# Patient Record
Sex: Male | Born: 1970 | Race: Black or African American | Hispanic: No | Smoking: Former smoker
Health system: Southern US, Community
[De-identification: ages and names within clinical notes are randomized; demographics above are authoritative.]

## PROBLEM LIST (undated history)

## (undated) DIAGNOSIS — I10 Essential (primary) hypertension: Secondary | ICD-10-CM

## (undated) DIAGNOSIS — E119 Type 2 diabetes mellitus without complications: Secondary | ICD-10-CM

## (undated) DIAGNOSIS — I2510 Atherosclerotic heart disease of native coronary artery without angina pectoris: Secondary | ICD-10-CM

## (undated) HISTORY — DX: Atherosclerotic heart disease of native coronary artery without angina pectoris: I25.10

## (undated) HISTORY — PX: SHOULDER SURGERY: SHX246

---

## 2000-08-02 ENCOUNTER — Emergency Department (HOSPITAL_COMMUNITY): Admission: EM | Admit: 2000-08-02 | Discharge: 2000-08-03 | Payer: Self-pay | Admitting: Emergency Medicine

## 2004-02-02 ENCOUNTER — Emergency Department (HOSPITAL_COMMUNITY): Admission: EM | Admit: 2004-02-02 | Discharge: 2004-02-03 | Payer: Self-pay | Admitting: Emergency Medicine

## 2004-02-08 ENCOUNTER — Ambulatory Visit (HOSPITAL_COMMUNITY): Admission: RE | Admit: 2004-02-08 | Discharge: 2004-02-08 | Payer: Self-pay | Admitting: Orthopedic Surgery

## 2004-09-02 ENCOUNTER — Emergency Department (HOSPITAL_COMMUNITY): Admission: EM | Admit: 2004-09-02 | Discharge: 2004-09-03 | Payer: Self-pay | Admitting: Emergency Medicine

## 2005-10-21 ENCOUNTER — Ambulatory Visit (HOSPITAL_COMMUNITY): Admission: RE | Admit: 2005-10-21 | Discharge: 2005-10-21 | Payer: Self-pay | Admitting: Orthopedic Surgery

## 2010-09-25 ENCOUNTER — Emergency Department (HOSPITAL_COMMUNITY)
Admission: EM | Admit: 2010-09-25 | Discharge: 2010-09-25 | Payer: Self-pay | Source: Home / Self Care | Admitting: Emergency Medicine
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
## 2015-09-01 ENCOUNTER — Encounter (HOSPITAL_COMMUNITY): Payer: Self-pay | Admitting: *Deleted

## 2015-09-01 ENCOUNTER — Emergency Department (HOSPITAL_COMMUNITY)
Admission: EM | Admit: 2015-09-01 | Discharge: 2015-09-01 | Disposition: A | Discharge status: Home / Self Care | Payer: No Typology Code available for payment source | Attending: Emergency Medicine | Admitting: Emergency Medicine

## 2015-09-01 ENCOUNTER — Emergency Department (HOSPITAL_COMMUNITY): Payer: No Typology Code available for payment source

## 2015-09-01 DIAGNOSIS — I10 Essential (primary) hypertension: Secondary | ICD-10-CM | POA: Insufficient documentation

## 2015-09-01 DIAGNOSIS — Z880 Allergy status to penicillin: Secondary | ICD-10-CM | POA: Diagnosis not present

## 2015-09-01 DIAGNOSIS — Y998 Other external cause status: Secondary | ICD-10-CM | POA: Diagnosis not present

## 2015-09-01 DIAGNOSIS — S299XXA Unspecified injury of thorax, initial encounter: Secondary | ICD-10-CM | POA: Diagnosis present

## 2015-09-01 DIAGNOSIS — Y9389 Activity, other specified: Secondary | ICD-10-CM | POA: Insufficient documentation

## 2015-09-01 DIAGNOSIS — Y92410 Unspecified street and highway as the place of occurrence of the external cause: Secondary | ICD-10-CM | POA: Insufficient documentation

## 2015-09-01 DIAGNOSIS — F17200 Nicotine dependence, unspecified, uncomplicated: Secondary | ICD-10-CM | POA: Diagnosis not present

## 2015-09-01 DIAGNOSIS — M5489 Other dorsalgia: Secondary | ICD-10-CM

## 2015-09-01 HISTORY — DX: Essential (primary) hypertension: I10

## 2015-09-01 MED ORDER — IBUPROFEN 400 MG PO TABS
800.0000 mg | ORAL_TABLET | Freq: Once | ORAL | Status: AC
Start: 2015-09-01 — End: 2015-09-01
  Administered 2015-09-01: 800 mg via ORAL
  Filled 2015-09-01: qty 4

## 2015-09-01 MED ORDER — ACETAMINOPHEN 325 MG PO TABS
650.0000 mg | ORAL_TABLET | Freq: Once | ORAL | Status: AC
  Administered 2015-09-01: 650 mg via ORAL
  Filled 2015-09-01: qty 2
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
## 2015-09-01 NOTE — ED Notes (Signed)
Pt is in stable condition upon d/c and ambulates from ED. 

## 2015-09-01 NOTE — ED Notes (Signed)
Patient transported to X-ray 
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
## 2016-02-24 ENCOUNTER — Emergency Department (HOSPITAL_COMMUNITY)
Admission: EM | Admit: 2016-02-24 | Discharge: 2016-02-24 | Disposition: A | Discharge status: Home / Self Care | Payer: No Typology Code available for payment source | Attending: Emergency Medicine | Admitting: Emergency Medicine

## 2016-02-24 ENCOUNTER — Encounter (HOSPITAL_COMMUNITY): Payer: Self-pay | Admitting: Family Medicine

## 2016-02-24 DIAGNOSIS — I10 Essential (primary) hypertension: Secondary | ICD-10-CM | POA: Insufficient documentation

## 2016-02-24 DIAGNOSIS — W57XXXA Bitten or stung by nonvenomous insect and other nonvenomous arthropods, initial encounter: Secondary | ICD-10-CM | POA: Insufficient documentation

## 2016-02-24 DIAGNOSIS — Y929 Unspecified place or not applicable: Secondary | ICD-10-CM | POA: Insufficient documentation

## 2016-02-24 DIAGNOSIS — F17210 Nicotine dependence, cigarettes, uncomplicated: Secondary | ICD-10-CM | POA: Insufficient documentation

## 2016-02-24 DIAGNOSIS — Y999 Unspecified external cause status: Secondary | ICD-10-CM | POA: Insufficient documentation

## 2016-02-24 DIAGNOSIS — Y939 Activity, unspecified: Secondary | ICD-10-CM | POA: Insufficient documentation

## 2016-02-24 DIAGNOSIS — L503 Dermatographic urticaria: Secondary | ICD-10-CM | POA: Insufficient documentation

## 2016-02-24 DIAGNOSIS — L509 Urticaria, unspecified: Secondary | ICD-10-CM

## 2016-02-24 MED ORDER — FAMOTIDINE 20 MG PO TABS
20.0000 mg | ORAL_TABLET | Freq: Once | ORAL | Status: AC
  Administered 2016-02-24: 20 mg via ORAL
  Filled 2016-02-24: qty 1

## 2016-02-24 MED ORDER — DIPHENHYDRAMINE HCL 25 MG PO TABS: 25.0000 mg | ORAL_TABLET | Freq: Four times a day (QID) | ORAL | Status: DC

## 2016-02-24 MED ORDER — DIPHENHYDRAMINE HCL 25 MG PO CAPS
25.0000 mg | ORAL_CAPSULE | Freq: Once | ORAL | Status: AC
  Administered 2016-02-24: 25 mg via ORAL
  Filled 2016-02-24: qty 1

## 2016-02-24 MED ORDER — DEXAMETHASONE SODIUM PHOSPHATE 10 MG/ML IJ SOLN
10.0000 mg | Freq: Once | INTRAMUSCULAR | Status: AC
  Administered 2016-02-24: 10 mg via INTRAMUSCULAR
  Filled 2016-02-24: qty 1

## 2016-02-24 MED ORDER — SULFAMETHOXAZOLE-TRIMETHOPRIM 800-160 MG PO TABS: 1.0000 | ORAL_TABLET | Freq: Two times a day (BID) | ORAL | Status: AC

## 2016-02-24 MED ORDER — FAMOTIDINE 20 MG PO TABS: 20.0000 mg | ORAL_TABLET | Freq: Two times a day (BID) | ORAL | Status: DC

## 2016-02-24 MED ORDER — PREDNISONE 20 MG PO TABS: 40.0000 mg | ORAL_TABLET | Freq: Every day | ORAL | Status: DC

## 2016-02-24 NOTE — ED Provider Notes (Signed)
CSN: [id number]     Arrival date & time 02/24/16  1509 History  By signing my name below, I, Mesha Guinyard, attest that this documentation has been prepared under the direction and in the presence of Treatment Team:  Attending Provider: Benjiman Core, MD Physician Assistant: Carlene Coria, PA-C.  Electronically Signed: Arvilla Market, Medical Scribe. 02/24/2016. 4:36 PM.   Chief Complaint  Patient presents with  . Insect Bite   The history is provided by the patient. No language interpreter was used.   HPI Comments: Jacob Santos is a 45 y.o. male who presents to the Emergency Department complaining of insect bites on the right side of his neck onset yesterday morning at 10 am while he was at a picnic table. He states that he is having associated symptoms of swelling in the shape of a long oval from site, and itchiness/irritation. He squeezed the bite and nothing came out. He states that he has not tried medication for relief of his symptoms. He denies difficulty swallowing, chills, and fever. He otherwise denies new exposures. States no one at home has similar lesions.  Past Medical History  Diagnosis Date  . Hypertension    Past Surgical History  Procedure Laterality Date  . Shoulder surgery     History reviewed. No pertinent family history. Social History  Substance Use Topics  . Smoking status: Current Every Day Smoker -- 0.50 packs/day    Types: Cigarettes  . Smokeless tobacco: None  . Alcohol Use: Yes     Comment: 1-2 a month.    Review of Systems  Constitutional: Negative for fever and chills.  HENT: Negative for trouble swallowing.   Skin: Positive for color change. Negative for pallor, rash and wound.  All other systems reviewed and are negative.  Allergies  Penicillins  Home Medications   Prior to Admission medications   Not on File   BP 144/94 mmHg  Pulse 70  Temp(Src) 98.9 F (37.2 C) (Oral)  Resp 20  SpO2 99% Physical Exam  Constitutional: He  appears well-developed and well-nourished. No distress.  HENT:  Head: Normocephalic and atraumatic.  Right Ear: External ear normal.  Left Ear: External ear normal.  Mouth/Throat: Oropharynx is clear and moist.  Two parallel 3cm long horizontal hive-like lesions on right lateral neck. Nontender. Non fluctuant. Minimal overlying erythema.   Tolerating secretions. No intraoral lesions. No angioedema. Uvula midline.  Eyes: Conjunctivae are normal.  Neck: Neck supple.  Cardiovascular: Normal rate.   Pulmonary/Chest: Effort normal and breath sounds normal. No respiratory distress. He has no wheezes. He has no rales.  Neurological: He is alert.  Skin: Skin is warm and dry.  No other skin lesions  Psychiatric: He has a normal mood and affect. His behavior is normal.  Nursing note and vitals reviewed.   ED Course  Procedures  DIAGNOSTIC STUDIES: Oxygen Saturation is 99% on RA, NL by my interpretation.    COORDINATION OF CARE: 4:36 PM Discussed treatment plan with pt at bedside and pt agreed to plan.  MDM   Final diagnoses:  Urticarial dermatitis    Lesions appear allergic/urticarial in etiology. Doubt abscess. Overlying erythema is minimal and looks more irritant than cellulitic. Will give course of benadryl, steroids, and pepcid. Pt requesting abx rx. Will give rx for bactrim but instructed to not take unless symptoms do not improve over the next 24-48 hours. Pt otherwise with no airway compromise, VSS, and nontoxic appearing. No further emergent workup indicated at this time. ER  return precautions given.  I personally performed the services described in this documentation, which was scribed in my presence. The recorded information has been reviewed and is accurate.   Carlene CoriaSerena Y Lavora Brisbon, PA-C 02/24/16 1807  Benjiman CoreNathan Pickering, MD 02/25/16 1249

## 2016-02-24 NOTE — Discharge Instructions (Signed)
Take medications as prescribed. Do not start the antibiotic unless your symptoms worsen or do not improve in a couple days. Return to the emergency room for new or worsening symptoms.

## 2016-02-24 NOTE — ED Notes (Signed)
Patient states he believes he was bitten by a spider sometime Friday night while sitting on the back patio. He denies feeling like something bit him. Pt's right neck is swollen, redness, with no skin breakdown.

## 2016-03-31 ENCOUNTER — Telehealth: Payer: Self-pay

## 2016-03-31 ENCOUNTER — Inpatient Hospital Stay (HOSPITAL_COMMUNITY)
Admission: EM | Admit: 2016-03-31 | Discharge: 2016-04-03 | DRG: 638 | Disposition: A | Discharge status: Home / Self Care | Payer: Self-pay | Attending: Internal Medicine | Admitting: Internal Medicine

## 2016-03-31 ENCOUNTER — Encounter (HOSPITAL_COMMUNITY): Payer: Self-pay | Admitting: Emergency Medicine

## 2016-03-31 ENCOUNTER — Other Ambulatory Visit: Payer: Self-pay

## 2016-03-31 ENCOUNTER — Inpatient Hospital Stay (HOSPITAL_COMMUNITY): Payer: Self-pay

## 2016-03-31 DIAGNOSIS — E1010 Type 1 diabetes mellitus with ketoacidosis without coma: Principal | ICD-10-CM

## 2016-03-31 DIAGNOSIS — E871 Hypo-osmolality and hyponatremia: Secondary | ICD-10-CM

## 2016-03-31 DIAGNOSIS — H538 Other visual disturbances: Secondary | ICD-10-CM | POA: Diagnosis present

## 2016-03-31 DIAGNOSIS — I10 Essential (primary) hypertension: Secondary | ICD-10-CM

## 2016-03-31 DIAGNOSIS — Z79899 Other long term (current) drug therapy: Secondary | ICD-10-CM

## 2016-03-31 DIAGNOSIS — K219 Gastro-esophageal reflux disease without esophagitis: Secondary | ICD-10-CM | POA: Diagnosis present

## 2016-03-31 DIAGNOSIS — E860 Dehydration: Secondary | ICD-10-CM | POA: Diagnosis present

## 2016-03-31 DIAGNOSIS — E1110 Type 2 diabetes mellitus with ketoacidosis without coma: Secondary | ICD-10-CM | POA: Diagnosis present

## 2016-03-31 DIAGNOSIS — R059 Cough, unspecified: Secondary | ICD-10-CM

## 2016-03-31 DIAGNOSIS — H547 Unspecified visual loss: Secondary | ICD-10-CM

## 2016-03-31 DIAGNOSIS — Z833 Family history of diabetes mellitus: Secondary | ICD-10-CM

## 2016-03-31 DIAGNOSIS — R05 Cough: Secondary | ICD-10-CM

## 2016-03-31 LAB — URINALYSIS, ROUTINE W REFLEX MICROSCOPIC
Bilirubin Urine: NEGATIVE
Glucose, UA: >1000 mg/dL — AB
Hgb urine dipstick: NEGATIVE
Ketones, ur: 15 mg/dL — AB
Leukocytes, UA: NEGATIVE
Nitrite: NEGATIVE
Protein, ur: NEGATIVE mg/dL
Specific Gravity, Urine: 1.04 — ABNORMAL HIGH (ref 1.005–1.030)
pH: 5.5 (ref 5.0–8.0)

## 2016-03-31 LAB — GLUCOSE, CAPILLARY
GLUCOSE-CAPILLARY: 274 mg/dL — AB (ref 65–99)
GLUCOSE-CAPILLARY: 203 mg/dL — AB (ref 65–99)
GLUCOSE-CAPILLARY: 196 mg/dL — AB (ref 65–99)
GLUCOSE-CAPILLARY: 278 mg/dL — AB (ref 65–99)
Glucose-Capillary: 354 mg/dL — ABNORMAL HIGH (ref 65–99)
Glucose-Capillary: 334 mg/dL — ABNORMAL HIGH (ref 65–99)
Glucose-Capillary: 211 mg/dL — ABNORMAL HIGH (ref 65–99)
Glucose-Capillary: 88 mg/dL (ref 65–99)

## 2016-03-31 LAB — BASIC METABOLIC PANEL
Anion gap: 16 — ABNORMAL HIGH (ref 5–15)
Anion gap: 5 (ref 5–15)
BUN: 22 mg/dL — ABNORMAL HIGH (ref 6–20)
BUN: 15 mg/dL (ref 6–20)
CHLORIDE: 110 mmol/L (ref 101–111)
CO2: 13 mmol/L — ABNORMAL LOW (ref 22–32)
CO2: 23 mmol/L (ref 22–32)
CREATININE: 0.71 mg/dL (ref 0.61–1.24)
Calcium: 9.3 mg/dL (ref 8.9–10.3)
Calcium: 8.9 mg/dL (ref 8.9–10.3)
Chloride: 98 mmol/L — ABNORMAL LOW (ref 101–111)
Creatinine, Ser: 1.07 mg/dL (ref 0.61–1.24)
GFR calc Af Amer: >60 mL/min (ref >60)
GFR calc Af Amer: >60 mL/min (ref >60)
GFR calc non Af Amer: >60 mL/min (ref >60)
GFR calc non Af Amer: >60 mL/min (ref >60)
Glucose, Bld: 561 mg/dL (ref 65–99)
Glucose, Bld: 88 mg/dL (ref 65–99)
Potassium: 5.1 mmol/L (ref 3.5–5.1)
Potassium: 3.6 mmol/L (ref 3.5–5.1)
SODIUM: 138 mmol/L (ref 135–145)
Sodium: 127 mmol/L — ABNORMAL LOW (ref 135–145)

## 2016-03-31 LAB — CBG MONITORING, ED
Glucose-Capillary: 574 mg/dL (ref 65–99)
Glucose-Capillary: 487 mg/dL — ABNORMAL HIGH (ref 65–99)
Glucose-Capillary: 439 mg/dL — ABNORMAL HIGH (ref 65–99)

## 2016-03-31 LAB — CBC
HCT: 44.1 % (ref 39.0–52.0)
Hemoglobin: 16.6 g/dL (ref 13.0–17.0)
MCH: 32.4 pg (ref 26.0–34.0)
MCHC: 37.6 g/dL — ABNORMAL HIGH (ref 30.0–36.0)
MCV: 86 fL (ref 78.0–100.0)
Platelets: 242 10*3/uL (ref 150–400)
RBC: 5.13 MIL/uL (ref 4.22–5.81)
RDW: 11.6 % (ref 11.5–15.5)
WBC: 8.2 10*3/uL (ref 4.0–10.5)

## 2016-03-31 LAB — BLOOD GAS, VENOUS
Acid-base deficit: 4.9 mmol/L — ABNORMAL HIGH (ref 0.0–2.0)
Bicarbonate: 19.7 mEq/L — ABNORMAL LOW (ref 20.0–24.0)
O2 Saturation: 89.3 %
Patient temperature: 98.6
TCO2: 16.4 mmol/L (ref 0–100)
pCO2, Ven: 37.2 mmHg — ABNORMAL LOW (ref 45.0–50.0)
pH, Ven: 7.345 — ABNORMAL HIGH (ref 7.250–7.300)
pO2, Ven: 59.3 mmHg — ABNORMAL HIGH (ref 31.0–45.0)

## 2016-03-31 LAB — URINE MICROSCOPIC-ADD ON
Bacteria, UA: NONE SEEN
RBC / HPF: NONE SEEN RBC/hpf (ref 0–5)
Squamous Epithelial / HPF: NONE SEEN

## 2016-03-31 LAB — MRSA PCR SCREENING: MRSA by PCR: NEGATIVE

## 2016-03-31 LAB — TROPONIN I: Troponin I: <0.03 ng/mL (ref <0.031)

## 2016-03-31 MED ORDER — SODIUM CHLORIDE 0.9 % IV SOLN
INTRAVENOUS | Status: DC
  Administered 2016-03-31: 17:00:00 via INTRAVENOUS

## 2016-03-31 MED ORDER — SODIUM CHLORIDE 0.9 % IV BOLUS (SEPSIS)
2000.0000 mL | Freq: Once | INTRAVENOUS | Status: AC
  Administered 2016-03-31: 2000 mL via INTRAVENOUS

## 2016-03-31 MED ORDER — DEXTROSE-NACL 5-0.45 % IV SOLN
INTRAVENOUS | Status: DC
  Administered 2016-03-31: 19:00:00 via INTRAVENOUS

## 2016-03-31 MED ORDER — NICOTINE 14 MG/24HR TD PT24: 14.0000 mg | MEDICATED_PATCH | Freq: Every day | TRANSDERMAL | Status: DC | PRN

## 2016-03-31 MED ORDER — SODIUM CHLORIDE 0.9 % IV SOLN
INTRAVENOUS | Status: DC
  Administered 2016-03-31: 15:00:00 via INTRAVENOUS

## 2016-03-31 MED ORDER — FAMOTIDINE 20 MG PO TABS
20.0000 mg | ORAL_TABLET | Freq: Two times a day (BID) | ORAL | Status: DC
  Administered 2016-03-31 – 2016-04-03 (×7): 20 mg via ORAL
  Filled 2016-03-31 (×7): qty 1

## 2016-03-31 MED ORDER — DEXTROSE-NACL 5-0.45 % IV SOLN: INTRAVENOUS | Status: DC

## 2016-03-31 MED ORDER — HYDRALAZINE HCL 20 MG/ML IJ SOLN
10.0000 mg | Freq: Four times a day (QID) | INTRAMUSCULAR | Status: DC | PRN
  Administered 2016-03-31 – 2016-04-01 (×2): 10 mg via INTRAVENOUS
  Filled 2016-03-31 (×2): qty 1

## 2016-03-31 MED ORDER — ENOXAPARIN SODIUM 40 MG/0.4ML ~~LOC~~ SOLN
40.0000 mg | SUBCUTANEOUS | Status: DC
  Administered 2016-03-31 – 2016-04-02 (×3): 40 mg via SUBCUTANEOUS
  Filled 2016-03-31 (×3): qty 0.4

## 2016-03-31 MED ORDER — AMLODIPINE BESYLATE 5 MG PO TABS
5.0000 mg | ORAL_TABLET | Freq: Every day | ORAL | Status: DC
  Administered 2016-03-31: 5 mg via ORAL
  Filled 2016-03-31: qty 1

## 2016-03-31 MED ORDER — SODIUM CHLORIDE 0.9 % IV SOLN
INTRAVENOUS | Status: DC
  Administered 2016-03-31: 3.8 [IU]/h via INTRAVENOUS
  Filled 2016-03-31: qty 2.5

## 2016-03-31 MED ORDER — SODIUM CHLORIDE 0.9 % IV SOLN: INTRAVENOUS | Status: DC

## 2016-03-31 NOTE — ED Notes (Addendum)
Patient reports urinary frequency, blurred vision, increased thirst, fatigue for the past 2 weeks.  Denies abdominal pain, chest pain, shob

## 2016-03-31 NOTE — Progress Notes (Signed)
CM spoke with [place] who [place]cp.  CM discussed and provided written information to assist pt with determining choice for uninsured accepting pcps, discussed the importance of pcp vs EDP services for f/u care, www.needymeds.org, www.goodrx.com, discounted pharmacies and other [place] resources such as [facility] , [facility], affordable care act, financial assistance, uninsured dental services, [place] med assist, DSS and  health department  Reviewed resources for [place] uninsured accepting pcps like [facility], family medicine at [address], [facility], [facility], local urgent care centers, [facility], [facility], general medical clinics, family services of the [place], [facility] plus others, medication resources, [facility] out patient pharmacies and housing Pt voiced understanding and appreciation of resources provided   Provided [facility] contact information Pt agreed to a referral Cm completed referral Pt to be contact by [facility] clinical liason Pt states he has a relationship with [facility] Previously was a pt there and states he is able to afford a f/u visit to [facility] if no eligibility to [facility] or other provider  Discussed [facility] with appt out to first week in August 2017, pending [facility], referral to [facility] with pending return call and [facility] and [facility] closed for lunch

## 2016-03-31 NOTE — Telephone Encounter (Signed)
Message received for Marval RegalKim Gibbs, RN CM requesting a [facility]. There are not any appointments available at this time. The patient will need to call the clinic to check for cancellations.    Update provided to K. Noelle PennerGibbs, RN CM.

## 2016-03-31 NOTE — H&P (Addendum)
History and Physical    SHAWN CARATTINI WPV:[id number] DOB: [birth year] DOA: 03/31/2016  PCP: No primary care provider on file.  Patient coming from: Home  Chief Complaint: polyuria, blurry vision   HPI: Jacob Santos is a 45 y.o. male with medical history significant of HTN, not taking medications who presents complaining of polyuria, polydipsia, and blurry vision that started day prior to admission. He also report dizziness. He report mild cough. He denies headaches, chest pain, dyspnea, focal weakness.   ED Course: hyperglycemia CBG 561, bicarb 13, gap 16, PH 7.3, UA positive for ketone.   Review of Systems: As per HPI otherwise 10 point review of systems negative.  Report burning , reflux symptoms.   Past Medical History  Diagnosis Date  . Hypertension     Past Surgical History  Procedure Laterality Date  . Shoulder surgery       reports that he has been smoking Cigarettes.  He has been smoking about 0.50 packs per day. He does not have any smokeless tobacco history on file. He reports that he drinks alcohol. He reports that he does not use illicit drugs.  Allergies  Allergen Reactions  . Penicillins     Unknown reaction  Has patient had a PCN reaction causing immediate rash, facial/tongue/throat swelling, SOB or lightheadedness with hypotension: unknown Has patient had a PCN reaction causing severe rash involving mucus membranes or skin necrosis: unknown Has patient had a PCN reaction that required hospitalization: unknown Has patient had a PCN reaction occurring within the last 10 years: unknown If all of the above answers are "NO", then may proceed with Cephalosporin use.     Family History;  Mother and brother diagnosed with Diabetes.    Prior to Admission medications   Medication Sig Start Date End Date Taking? Authorizing Provider  ibuprofen (ADVIL,MOTRIN) 200 MG tablet Take 600 mg by mouth every 6 (six) hours as needed for fever, headache, mild pain, moderate  pain or cramping.   Yes Historical Provider, MD  diphenhydrAMINE (BENADRYL) 25 MG tablet Take 1 tablet (25 mg total) by mouth every 6 (six) hours. Patient not taking: Reported on 03/31/2016 02/24/16   Olivia Canter Sam, PA-C  famotidine (PEPCID) 20 MG tablet Take 1 tablet (20 mg total) by mouth 2 (two) times daily. Patient not taking: Reported on 03/31/2016 02/24/16   Olivia Canter Sam, PA-C  predniSONE (DELTASONE) 20 MG tablet Take 2 tablets (40 mg total) by mouth daily. Patient not taking: Reported on 03/31/2016 02/24/16   Anne Ng, PA-C    Physical Exam: Filed Vitals:   03/31/16 1133  BP: 148/105  Pulse: 88  Temp: 98.2 F (36.8 C)  TempSrc: Oral  Resp: 16  Height: _0  (1.676 m)  Weight: 83.008 kg (183 lb)  SpO2: 100%      Constitutional: NAD, calm, comfortable Filed Vitals:   03/31/16 1133  BP: 148/105  Pulse: 88  Temp: 98.2 F (36.8 C)  TempSrc: Oral  Resp: 16  Height: _1  (1.676 m)  Weight: 83.008 kg (183 lb)  SpO2: 100%   Eyes: PERRL, lids and conjunctivae normal ENMT: Mucous membranes are moist. Posterior pharynx clear of any exudate or lesions.Normal dentition.  Neck: normal, supple, no masses, no thyromegaly Respiratory: clear to auscultation bilaterally, no wheezing, no crackles. Normal respiratory effort. No accessory muscle use.  Cardiovascular: Regular rate and rhythm, no murmurs / rubs / gallops. No extremity edema. 2+ pedal pulses. No carotid bruits.  Abdomen: no tenderness, no masses  palpated. No hepatosplenomegaly. Bowel sounds positive.  Musculoskeletal: no clubbing / cyanosis. No joint deformity upper and lower extremities. Good ROM, no contractures. Normal muscle tone.  Skin: no rashes, lesions, ulcers. No induration Neurologic: CN 2-12 grossly intact. Sensation intact, DTR normal. Strength 5/5 in all 4.  Psychiatric: Normal judgment and insight. Alert and oriented x 3. Normal mood.    Labs on Admission: I have personally reviewed following labs and  imaging studies  CBC:  Recent Labs Lab 03/31/16 1155  WBC 8.2  HGB 16.6  HCT 44.1  MCV 86.0  PLT 854   Basic Metabolic Panel:  Recent Labs Lab 03/31/16 1155  NA 127*  K 5.1  CL 98*  CO2 13*  GLUCOSE 561*  BUN 22*  CREATININE 1.07  CALCIUM 9.3   GFR: Estimated Creatinine Clearance: 88.2 mL/min (by C-G formula based on Cr of 1.07). Liver Function Tests: No results for input(s): AST, ALT, ALKPHOS, BILITOT, PROT, ALBUMIN in the last 168 hours. No results for input(s): LIPASE, AMYLASE in the last 168 hours. No results for input(s): AMMONIA in the last 168 hours. Coagulation Profile: No results for input(s): INR, PROTIME in the last 168 hours. Cardiac Enzymes: No results for input(s): CKTOTAL, CKMB, CKMBINDEX, TROPONINI in the last 168 hours. BNP (last 3 results) No results for input(s): PROBNP in the last 8760 hours. HbA1C: No results for input(s): HGBA1C in the last 72 hours. CBG:  Recent Labs Lab 03/31/16 1133 03/31/16 1326 03/31/16 1400  GLUCAP 574* 487* 439*   Lipid Profile: No results for input(s): CHOL, HDL, LDLCALC, TRIG, CHOLHDL, LDLDIRECT in the last 72 hours. Thyroid Function Tests: No results for input(s): TSH, T4TOTAL, FREET4, T3FREE, THYROIDAB in the last 72 hours. Anemia Panel: No results for input(s): VITAMINB12, FOLATE, FERRITIN, TIBC, IRON, RETICCTPCT in the last 72 hours. Urine analysis:    Component Value Date/Time   COLORURINE YELLOW 03/31/2016 [place] 03/31/2016 1208   LABSPEC 1.040* 03/31/2016 1208   PHURINE 5.5 03/31/2016 1208   GLUCOSEU >1000* 03/31/2016 1208   HGBUR NEGATIVE 03/31/2016 1208   [place] 03/31/2016 1208   KETONESUR 15* 03/31/2016 1208   PROTEINUR NEGATIVE 03/31/2016 1208   NITRITE NEGATIVE 03/31/2016 1208   LEUKOCYTESUR NEGATIVE 03/31/2016 1208   Sepsis Labs: !!!!!!!!!!!!!!!!!!!!!!!!!!!!!!!!!!!!!!!!!!!! _0 (procalcitonin:4,lacticidven:4) )No results found for this or any  previous visit (from the past 240 hour(s)).   Radiological Exams on Admission: No results found.  EKG: ordered.   Assessment/Plan Active Problems:   DKA, type 1 (HCC)   Hyponatremia   HTN (hypertension)  1-DKA, new diagnosis of Diabetes.  Presents with DKA. He will need to be discharge on insulin.  Check HB A1c.  IV fluids, insulin Gtt. B-met every 4 hours.  Transition to lantus/levemir when gap close and bicarb above 20.  Check chest x ray.   2-Hyponatremia; pseudohyponatremia secondary to hyperglycemia and also dehydration. IV fluids. Correction of DKA.  3-Malignant HTN.  Blurry vision could be also related to DKA,hyperglycemia./  IV Hydralazine .  Will also start Norvasc.  If Blurry vision doesn't improved consider MRI brain.   4-Screening for HIV.   5-GERD; start Pepcid.   DVT prophylaxis: Lovenox.  Code Status: Full code.  Family Communication: Fiance who was at bedside.  Disposition Plan: home in 48 hours.  Consults called: None Admission status: inpatient, observation.    Elmarie Shiley MD [facility] Pager [phone]  If 7PM-7AM, please contact night-coverage [email or web address] Password [email or web address]  03/31/2016, 2:26 PM

## 2016-03-31 NOTE — Progress Notes (Signed)
Entered in d/c instructions  Please use the resources provided to you in emergency room by case manager to assist you're your choice of doctor for follow up A referral for you has been sent to [facility] if you have not received a call in 3 days you may contact them Call Karen Andrianos at [phone] Tuesday-Friday [email or web address] These [place] uninsured resources provide possible primary care providers, resources for discounted medications, housing, dental resources, affordable care act information, plus other resources for [place]   

## 2016-03-31 NOTE — ED Notes (Signed)
Critical lab received GLU 561.  Primary RN notified.

## 2016-03-31 NOTE — ED Provider Notes (Signed)
CSN: [id number]     Arrival date & time 03/31/16  1126 History   First MD Initiated Contact with Patient 03/31/16 1205     Chief Complaint  Patient presents with  . Polyuria  . Blurred Vision     (Consider location/radiation/quality/duration/timing/severity/associated sxs/prior Treatment) HPI  Blood pressure 148/105, pulse 88, temperature 98.2 F (36.8 C), temperature source Oral, resp. rate 16, SpO2 100 %.  Jacob Santos is a 45 y.o. male complaining of polyuria, polydipsia, fatigue onset 2 weeks ago with associated blurred vision onset last night. She also has a weight loss of 11 lbs in 4 days. Denies headache, nausea, vomiting, chest pain, dyspepsia, change in bowel habits. Family history of type 2 diabetes.   Past Medical History  Diagnosis Date  . Hypertension    Past Surgical History  Procedure Laterality Date  . Shoulder surgery     History reviewed. No pertinent family history. Social History  Substance Use Topics  . Smoking status: Current Every Day Smoker -- 0.50 packs/day    Types: Cigarettes  . Smokeless tobacco: None  . Alcohol Use: Yes     Comment: 1-2 a month.    Review of Systems  10 systems reviewed and found to be negative, except as noted in the HPI.   Allergies  Penicillins  Home Medications   Prior to Admission medications   Medication Sig Start Date End Date Taking? Authorizing Provider  ibuprofen (ADVIL,MOTRIN) 200 MG tablet Take 600 mg by mouth every 6 (six) hours as needed for fever, headache, mild pain, moderate pain or cramping.   Yes Historical Provider, MD  diphenhydrAMINE (BENADRYL) 25 MG tablet Take 1 tablet (25 mg total) by mouth every 6 (six) hours. Patient not taking: Reported on 03/31/2016 02/24/16   Ace GinsSerena Y Sam, PA-C  famotidine (PEPCID) 20 MG tablet Take 1 tablet (20 mg total) by mouth 2 (two) times daily. Patient not taking: Reported on 03/31/2016 02/24/16   Ace GinsSerena Y Sam, PA-C  predniSONE (DELTASONE) 20 MG tablet Take 2 tablets  (40 mg total) by mouth daily. Patient not taking: Reported on 03/31/2016 02/24/16   Ace GinsSerena Y Sam, PA-C   BP 148/105 mmHg  Pulse 88  Temp(Src) 98.2 F (36.8 C) (Oral)  Resp 16  SpO2 100% Physical Exam  Constitutional: He is oriented to person, place, and time. He appears well-developed and well-nourished. No distress.  HENT:  Head: Normocephalic.  Dry mucous membranes  Eyes: Conjunctivae and EOM are normal.  Cardiovascular: Normal rate and regular rhythm.   Pulmonary/Chest: Effort normal and breath sounds normal. No stridor. No respiratory distress. He has no wheezes. He has no rales. He exhibits no tenderness.  Abdominal: Soft. Bowel sounds are normal. He exhibits no distension and no mass. There is no tenderness. There is no rebound and no guarding.  Musculoskeletal: Normal range of motion.  Neurological: He is alert and oriented to person, place, and time.  Psychiatric: He has a normal mood and affect.  Nursing note and vitals reviewed.   ED Course  Procedures (including critical care time) Labs Review Labs Reviewed  URINALYSIS, ROUTINE W REFLEX MICROSCOPIC (NOT AT [facility]) - Abnormal; Notable for the following:    Specific Gravity, Urine 1.040 (*)    Glucose, UA >1000 (*)    Ketones, ur 15 (*)    All other components within normal limits  CBG MONITORING, ED - Abnormal; Notable for the following:    Glucose-Capillary 574 (*)    All other components within normal limits  URINE MICROSCOPIC-ADD ON  BASIC METABOLIC PANEL  CBC  BLOOD GAS, VENOUS    Imaging Review No results found. I have personally reviewed and evaluated these images and lab results as part of my medical decision-making.   EKG Interpretation None      MDM   Final diagnoses:  Diabetic ketoacidosis without coma associated with type 2 diabetes mellitus (HCC)   Filed Vitals:   03/31/16 1133  BP: 148/105  Pulse: 88  Temp: 98.2 F (36.8 C)  TempSrc: Oral  Resp: 16  Height: 5\' 6"  (1.676 m)  Weight:  83.008 kg  SpO2: 100%    Medications  dextrose 5 %-0.45 % sodium chloride infusion (not administered)  insulin regular (NOVOLIN R,HUMULIN R) 250 Units in sodium chloride 0.9 % 250 mL (1 Units/mL) infusion (3.8 Units/hr Intravenous New Bag/Given 03/31/16 1405)  hydrALAZINE (APRESOLINE) injection 10 mg (not administered)  0.9 %  sodium chloride infusion (not administered)  sodium chloride 0.9 % bolus 2,000 mL (0 mLs Intravenous Stopped 03/31/16 1425)    Jacob Santos is 45 y.o. male presenting with Polyuria, polydipsia unintentional weight loss and blurred vision onset this morning. Patient has family history of diabetes, CBG on arrival was 6487. Patient has a minimal amount of ketones in the urine but greater than 1000 glucose. He minimally elevated anion gap of 16 but a low bicarbonate of 13. Corrected sodium of 134. Patient has no outpatient primary care, technically meets criteria for DKA. Patient will be admitted to a step down bed under the care of Dr. Sunnie Nielsenegalado.         Wynetta Emeryicole Jrue Yambao, PA-C 03/31/16 1429  Tilden FossaElizabeth Rees, MD 03/31/16 [phone]

## 2016-03-31 NOTE — Progress Notes (Addendum)
[facility] CM states no [facility] program availability for the pt at this time Pt & significant other updated  [facility] availability on April 29 2016 2 pm  CM spoke with pt significant other who states pt will be agreeable to the appt because it will be after "our beach trip.  We will leave out on Thursday the twentieth and return that Monday"   Cm [facility] appt for the pt on 04/29/16 at 2  Pm with Dr Julieanne MansonElizabeth Mulberry   Entered in d/c instructions Please use the resources provided to you in emergency room by case manager to assist you're your choice of doctor for follow up A referral for you has been sent to [facility] if you have not received a call in 3 days you may contact them Call Scherry RanKaren Andrianos at [phone] Tuesday-Friday [email or web address] [place] resources provide possible primary care providers, resources for discounted medications, housing, dental resources, affordable care act information, plus other resources for [place]   Mulberry, Lanora Manislizabeth Go on 04/29/2016 You have a scheduled appointment to establish care with Dr [facility]rae [facility]Mulberry [facility]/16 at 2 pm Please bring medication list, fee and discharge instructions You may also follow up on the status of any orange card application in this office [address] [place] [address] [phone]

## 2016-03-31 NOTE — Progress Notes (Signed)
Paged K.Schorr NP results of BMet, recent CBG, and insulin gtt rate.

## 2016-03-31 NOTE — ED Notes (Signed)
Pt reports polyuria, voiding full bladder every five minutes, polydipsia x several weeks, blurred vision onset last night, weight loss. Pt weight 203 lbs on Thursday 6/22, 192 lbs on Saturday 6/24, and 183 lbs on Sunday 6/25. Pt also reports new onset erectile dysfunction on Saturday. CBG 574. No hx DM.

## 2016-04-01 ENCOUNTER — Inpatient Hospital Stay (HOSPITAL_COMMUNITY): Payer: Self-pay

## 2016-04-01 DIAGNOSIS — I10 Essential (primary) hypertension: Secondary | ICD-10-CM

## 2016-04-01 LAB — GLUCOSE, CAPILLARY
GLUCOSE-CAPILLARY: 112 mg/dL — AB (ref 65–99)
GLUCOSE-CAPILLARY: 295 mg/dL — AB (ref 65–99)
GLUCOSE-CAPILLARY: 346 mg/dL — AB (ref 65–99)
Glucose-Capillary: 105 mg/dL — ABNORMAL HIGH (ref 65–99)
Glucose-Capillary: 127 mg/dL — ABNORMAL HIGH (ref 65–99)
Glucose-Capillary: 142 mg/dL — ABNORMAL HIGH (ref 65–99)
Glucose-Capillary: 228 mg/dL — ABNORMAL HIGH (ref 65–99)

## 2016-04-01 LAB — BASIC METABOLIC PANEL
Anion gap: 6 (ref 5–15)
Anion gap: 6 (ref 5–15)
Anion gap: 7 (ref 5–15)
BUN: 16 mg/dL (ref 6–20)
BUN: 16 mg/dL (ref 6–20)
BUN: 15 mg/dL (ref 6–20)
CALCIUM: 8.5 mg/dL — AB (ref 8.9–10.3)
CALCIUM: 8.5 mg/dL — AB (ref 8.9–10.3)
CO2: 21 mmol/L — AB (ref 22–32)
CO2: 21 mmol/L — AB (ref 22–32)
CO2: 22 mmol/L (ref 22–32)
CREATININE: 0.78 mg/dL (ref 0.61–1.24)
CREATININE: 0.68 mg/dL (ref 0.61–1.24)
Calcium: 8.5 mg/dL — ABNORMAL LOW (ref 8.9–10.3)
Chloride: 109 mmol/L (ref 101–111)
Chloride: 110 mmol/L (ref 101–111)
Chloride: 107 mmol/L (ref 101–111)
Creatinine, Ser: 0.89 mg/dL (ref 0.61–1.24)
GFR calc Af Amer: >60 mL/min (ref >60)
GFR calc Af Amer: >60 mL/min (ref >60)
GFR calc non Af Amer: >60 mL/min (ref >60)
GFR calc non Af Amer: >60 mL/min (ref >60)
GFR calc non Af Amer: >60 mL/min (ref >60)
GLUCOSE: 259 mg/dL — AB (ref 65–99)
GLUCOSE: 117 mg/dL — AB (ref 65–99)
Glucose, Bld: 185 mg/dL — ABNORMAL HIGH (ref 65–99)
Potassium: 3.3 mmol/L — ABNORMAL LOW (ref 3.5–5.1)
Potassium: 3.7 mmol/L (ref 3.5–5.1)
Potassium: 3.9 mmol/L (ref 3.5–5.1)
Sodium: 136 mmol/L (ref 135–145)
Sodium: 137 mmol/L (ref 135–145)
Sodium: 136 mmol/L (ref 135–145)

## 2016-04-01 LAB — HIV ANTIBODY (ROUTINE TESTING W REFLEX): HIV Screen 4th Generation wRfx: NONREACTIVE

## 2016-04-01 LAB — HEMOGLOBIN A1C
Hgb A1c MFr Bld: 9.8 % — ABNORMAL HIGH (ref 4.8–5.6)
Mean Plasma Glucose: 235 mg/dL

## 2016-04-01 LAB — TROPONIN I: Troponin I: <0.03 ng/mL (ref <0.03)

## 2016-04-01 MED ORDER — HYDRALAZINE HCL 20 MG/ML IJ SOLN
10.0000 mg | Freq: Once | INTRAMUSCULAR | Status: AC
  Administered 2016-04-01: 10 mg via INTRAVENOUS
  Filled 2016-04-01: qty 1

## 2016-04-01 MED ORDER — INSULIN GLARGINE 100 UNIT/ML ~~LOC~~ SOLN
15.0000 [IU] | Freq: Every day | SUBCUTANEOUS | Status: DC
  Administered 2016-04-01: 15 [IU] via SUBCUTANEOUS
  Filled 2016-04-01: qty 0.15

## 2016-04-01 MED ORDER — INSULIN ASPART 100 UNIT/ML ~~LOC~~ SOLN
3.0000 [IU] | Freq: Three times a day (TID) | SUBCUTANEOUS | Status: DC
  Administered 2016-04-01 – 2016-04-03 (×5): 3 [IU] via SUBCUTANEOUS

## 2016-04-01 MED ORDER — POTASSIUM CHLORIDE 10 MEQ/100ML IV SOLN
10.0000 meq | INTRAVENOUS | Status: AC
  Administered 2016-04-01 (×3): 10 meq via INTRAVENOUS
  Filled 2016-04-01: qty 100

## 2016-04-01 MED ORDER — INSULIN STARTER KIT- SYRINGES (ENGLISH)
1.0000 | Freq: Once | Status: AC
  Administered 2016-04-01: 1
  Filled 2016-04-01: qty 1

## 2016-04-01 MED ORDER — INSULIN ASPART 100 UNIT/ML ~~LOC~~ SOLN
0.0000 [IU] | Freq: Three times a day (TID) | SUBCUTANEOUS | Status: DC
  Administered 2016-04-01: 5 [IU] via SUBCUTANEOUS
  Administered 2016-04-01 – 2016-04-02 (×3): 8 [IU] via SUBCUTANEOUS
  Administered 2016-04-02: 15 [IU] via SUBCUTANEOUS
  Administered 2016-04-02: 5 [IU] via SUBCUTANEOUS
  Administered 2016-04-03: 8 [IU] via SUBCUTANEOUS

## 2016-04-01 MED ORDER — INSULIN GLARGINE 100 UNIT/ML ~~LOC~~ SOLN
10.0000 [IU] | Freq: Every day | SUBCUTANEOUS | Status: DC
  Administered 2016-04-01: 10 [IU] via SUBCUTANEOUS
  Filled 2016-04-01: qty 0.1

## 2016-04-01 MED ORDER — LIVING WELL WITH DIABETES BOOK
Freq: Once | Status: AC
  Administered 2016-04-01: 16:00:00
  Filled 2016-04-01: qty 1

## 2016-04-01 MED ORDER — AMLODIPINE BESYLATE 10 MG PO TABS
10.0000 mg | ORAL_TABLET | Freq: Every day | ORAL | Status: DC
  Administered 2016-04-01 – 2016-04-03 (×3): 10 mg via ORAL
  Filled 2016-04-01 (×3): qty 1

## 2016-04-01 NOTE — Progress Notes (Signed)
Inpatient Diabetes Program Recommendations  AACE/ADA: New Consensus Statement on Inpatient Glycemic Control (2015)  Target Ranges:  Prepandial:   less than 140 mg/dL      Peak postprandial:   less than 180 mg/dL (1-2 hours)      Critically ill patients:  140 - 180 mg/dL   Lab Results  Component Value Date   GLUCAP 228* 04/01/2016   HGBA1C 9.8* 03/31/2016  Results for Jacob Santos, Jacob Santos (MRN [id number]) as of 04/02/2016 11:05  Ref. Range 04/01/2016 08:08 04/01/2016 12:24 04/01/2016 16:26 04/01/2016 21:16 04/02/2016 07:44  Glucose-Capillary Latest Ref Range: 65-99 mg/dL 228 (H) 284 (H) 295 (H) 346 (H) 351 (H)    Review of Glycemic Control  Pt in good spirits. Disappointed in blood sugars this am. Explained to pt that we were trying to adjust his insulin and it's not his fault that blood sugars were high. Needs affordable insulin at discharge.  Pt has pricked finger to check blood sugars and gave insulin via syringe this am.  Encouraged pt to walk in halls to get some exercise.  Inpatient Diabetes Program Recommendations:    Consider changing insulin to 70/30 since pt will need affordable insulin at home. 70/30 18 units bid - please start 6/28 at 1700. Add HS correction. Continue with education per RN.  Thank you. Lorenda Peck, RD, LDN, CDE Inpatient Diabetes Coordinator [phone]  At discharge, please order: Blood glucose meter kit - #[id number] Insulin syringes - [id number] Novolin R ([facility] at [facility]) for s/s 70/30 insulin ([place] [facility] [place])

## 2016-04-01 NOTE — Progress Notes (Signed)
03/31/16  1630  Patient watched education videos 501-507. Patient will continue video on the 4th floor (1403). Patient given diabetes education book and kit.

## 2016-04-01 NOTE — Progress Notes (Signed)
PROGRESS NOTE    Jacob GriceDano T Hebner  ZOX:[id number] DOB: [birth year] DOA: 03/31/2016 PCP: No primary care provider on file.    Brief Narrative: Jacob Santos is a 45 y.o. male with medical history significant of HTN, not taking medications who presents complaining of polyuria, polydipsia, and blurry vision that started day prior to admission. He also report dizziness. He report mild cough. He denies headaches, chest pain, dyspnea, focal weakness.  hyperglycemia CBG 561, bicarb 13, gap 16, PH 7.3, UA positive for ketone.    Assessment & Plan:   Active Problems:   DKA, type 1 (HCC)   Hyponatremia   HTN (hypertension)   DKA (diabetic ketoacidoses) (HCC)  1-DKA, new diagnosis of Diabetes.  Presents with DKA. He will need to be discharge on insulin.  HB A1c 9.8.  He was transition from insulin gtt to lantus.  Increase lantus to 15 units daily/   2-Hyponatremia; pseudohyponatremia secondary to hyperglycemia and also dehydration. IV fluids. Correction of DKA. Resolved.   3-Malignant HTN.  Blurry vision could be also related to DKA,hyperglycemia./  IV Hydralazine PRN .  Increased Norvasc 10 mg daily.  Extra dose of IV hydralazine now. Might need to add second oral agent if BP is not controlled with increase dose of Norvasc.   4-Screening for HIV. Pending.   5-GERD; start Pepcid. 6-Vision Changes. Report blurry vision improved. Now notice difficulty reading from distance. Will get MRI.  Discussed with opthalmology, patient needs to follow up outpatient.    DVT prophylaxis: Lovenox.  Code Status:  Full code.  Family Communication: wife at bedside.  Disposition Plan: remain in the step down to controlled BP.    Consultants:   Opthalmology, phone.   Procedures:   None  Antimicrobials:  None  Subjective: Report improvement of blurry vision, fogginess. Now notice difficulty reading from distance.    Objective: Filed Vitals:   04/01/16 0200 04/01/16 0300 04/01/16 0400  04/01/16 0653  BP:    117/112  Pulse: 67 67 67   Temp:      TempSrc:      Resp: 14 17 15    Height:      Weight:      SpO2: 98% 98% 98%     Intake/Output Summary (Last 24 hours) at 04/01/16 0832 Last data filed at 04/01/16 0700  Gross per 24 hour  Intake 1870.69 ml  Output      0 ml  Net 1870.69 ml   Filed Weights   03/31/16 1133  Weight: 83.008 kg (183 lb)    Examination:  General exam: Appears calm and comfortable  Respiratory system: Clear to auscultation. Respiratory effort normal. Cardiovascular system: S1 & S2 heard, RRR. No JVD, murmurs, rubs, gallops or clicks. No pedal edema. Gastrointestinal system: Abdomen is nondistended, soft and nontender. No organomegaly or masses felt. Normal bowel sounds heard. Central nervous system: Alert and oriented. No focal neurological deficits. Extremities: Symmetric 5 x 5 power. Skin: No rashes, lesions or ulcers Psychiatry: Judgement and insight appear normal. Mood & affect appropriate.     Data Reviewed: I have personally reviewed following labs and imaging studies  CBC:  Recent Labs Lab 03/31/16 1155  WBC 8.2  HGB 16.6  HCT 44.1  MCV 86.0  PLT 242   Basic Metabolic Panel:  Recent Labs Lab 03/31/16 1155 03/31/16 2006 03/31/16 2322 04/01/16 0313 04/01/16 0617  NA 127* 138 136 137 136  K 5.1 3.6 3.3* 3.7 3.9  CL 98* 110 109 110 107  CO2  13* 23 21* 21* 22  GLUCOSE 561* 88 259* 117* 185*  BUN 22* 15 16 16 15   CREATININE 1.07 0.71 0.78 0.68 0.89  CALCIUM 9.3 8.9 8.5* 8.5* 8.5*   GFR: Estimated Creatinine Clearance: 106 mL/min (by C-G formula based on Cr of 0.89). Liver Function Tests: No results for input(s): AST, ALT, ALKPHOS, BILITOT, PROT, ALBUMIN in the last 168 hours. No results for input(s): LIPASE, AMYLASE in the last 168 hours. No results for input(s): AMMONIA in the last 168 hours. Coagulation Profile: No results for input(s): INR, PROTIME in the last 168 hours. Cardiac Enzymes:  Recent  Labs Lab 03/31/16 2006 04/01/16 0313  TROPONINI <0.03 <0.03   BNP (last 3 results) No results for input(s): PROBNP in the last 8760 hours. HbA1C:  Recent Labs  03/31/16 1328  HGBA1C 9.8*   CBG:  Recent Labs Lab 03/31/16 2316 04/01/16 0025 04/01/16 0129 04/01/16 0235 04/01/16 0341  GLUCAP 278* 112* 105* 127* 142*   Lipid Profile: No results for input(s): CHOL, HDL, LDLCALC, TRIG, CHOLHDL, LDLDIRECT in the last 72 hours. Thyroid Function Tests: No results for input(s): TSH, T4TOTAL, FREET4, T3FREE, THYROIDAB in the last 72 hours. Anemia Panel: No results for input(s): VITAMINB12, FOLATE, FERRITIN, TIBC, IRON, RETICCTPCT in the last 72 hours. Sepsis Labs: No results for input(s): PROCALCITON, LATICACIDVEN in the last 168 hours.  Recent Results (from the past 240 hour(s))  MRSA PCR Screening     Status: None   Collection Time: 03/31/16  3:22 PM  Result Value Ref Range Status   MRSA by PCR NEGATIVE NEGATIVE Final    Comment:        The GeneXpert MRSA Assay (FDA approved for NASAL specimens only), is one component of a comprehensive MRSA colonization surveillance program. It is not intended to diagnose MRSA infection nor to guide or monitor treatment for MRSA infections.          Radiology Studies: Dg Chest 2 View  03/31/2016  CLINICAL DATA:  Blurred vision for 2 days, diabetes mellitus, frequent urination, hypertension, smoker EXAM: CHEST  2 VIEW COMPARISON:  02/07/2004 FINDINGS: Upper normal heart size. Mediastinal contours and pulmonary vascularity normal. Lungs clear. No pleural effusion or pneumothorax. Minimal degenerative changes RIGHT glenohumeral joint. No acute osseous findings. Jewelry artifacts at the nipples bilaterally. IMPRESSION: No acute abnormalities. Electronically Signed   By: Ulyses SouthwardMark  Boles M.D.   On: 03/31/2016 15:09        Scheduled Meds: . amLODipine  5 mg Oral Daily  . enoxaparin (LOVENOX) injection  40 mg Subcutaneous Q24H  .  famotidine  20 mg Oral BID  . insulin aspart  0-15 Units Subcutaneous TID WC  . insulin glargine  10 Units Subcutaneous QHS   Continuous Infusions: . sodium chloride 100 mL/hr at 04/01/16 0402     LOS: 1 day    Time spent: 35 minutes.     Alba Coryegalado, Brae Schaafsma A, MD [facility] Pager [phone]  If 7PM-7AM, please contact night-coverage [email or web address] Password [email or web address] 04/01/2016, 8:32 AM

## 2016-04-01 NOTE — Progress Notes (Signed)
Pt. Performed his first stick to obtain his CBG and gave himself his insulin shot. Did very well and asked many questions about what to do at home. Pt. Educated and education materials given.

## 2016-04-01 NOTE — Progress Notes (Signed)
Received transfer patient  from ICU, VS obtained, oriented to unit, call light placed in reach

## 2016-04-01 NOTE — Progress Notes (Signed)
   04/01/16 0653  Vitals  BP (!) 117/112 mmHg  Apresoline 10 mg given at this time.

## 2016-04-01 NOTE — Progress Notes (Signed)
Inpatient Diabetes Program Recommendations  AACE/ADA: New Consensus Statement on Inpatient Glycemic Control (2015)  Target Ranges:  Prepandial:   less than 140 mg/dL      Peak postprandial:   less than 180 mg/dL (1-2 hours)      Critically ill patients:  140 - 180 mg/dL   Lab Results  Component Value Date   GLUCAP 228* 04/01/2016   HGBA1C 9.8* 03/31/2016    Review of Glycemic Control  Diabetes history: None Outpatient Diabetes medications: None Current orders for Inpatient glycemic control: Lantus 15 units QHS, Novolog moderate tidwc  Given Lantus 10 units and transitioned off insulin drip.  Inpatient Diabetes Program Recommendations:    Will likely need meal coverage insulin - Novolog 3 units tidwc (check post-prandials to adjust) Add HS correction. Titrate Lantus until FBS < 180 mg/dL.  To follow-up this afternoon to answer questions.  Thank you. Jacob Santos, RD, LDN, CDE Inpatient Diabetes Coordinator [phone]

## 2016-04-02 ENCOUNTER — Telehealth: Payer: Self-pay

## 2016-04-02 DIAGNOSIS — E1010 Type 1 diabetes mellitus with ketoacidosis without coma: Principal | ICD-10-CM

## 2016-04-02 LAB — CBC
HEMATOCRIT: 39.5 % (ref 39.0–52.0)
Hemoglobin: 14.2 g/dL (ref 13.0–17.0)
MCH: 31.1 pg (ref 26.0–34.0)
MCHC: 35.9 g/dL (ref 30.0–36.0)
MCV: 86.4 fL (ref 78.0–100.0)
Platelets: 203 10*3/uL (ref 150–400)
RBC: 4.57 MIL/uL (ref 4.22–5.81)
RDW: 11.6 % (ref 11.5–15.5)
WBC: 5.7 10*3/uL (ref 4.0–10.5)

## 2016-04-02 LAB — BASIC METABOLIC PANEL
ANION GAP: 6 (ref 5–15)
BUN: 18 mg/dL (ref 6–20)
CHLORIDE: 106 mmol/L (ref 101–111)
CO2: 21 mmol/L — AB (ref 22–32)
Calcium: 8.8 mg/dL — ABNORMAL LOW (ref 8.9–10.3)
Creatinine, Ser: 0.79 mg/dL (ref 0.61–1.24)
GFR calc non Af Amer: >60 mL/min (ref >60)
Glucose, Bld: 349 mg/dL — ABNORMAL HIGH (ref 65–99)
POTASSIUM: 4 mmol/L (ref 3.5–5.1)
Sodium: 133 mmol/L — ABNORMAL LOW (ref 135–145)

## 2016-04-02 LAB — GLUCOSE, CAPILLARY
GLUCOSE-CAPILLARY: 351 mg/dL — AB (ref 65–99)
GLUCOSE-CAPILLARY: 254 mg/dL — AB (ref 65–99)
Glucose-Capillary: 284 mg/dL — ABNORMAL HIGH (ref 65–99)
Glucose-Capillary: 284 mg/dL — ABNORMAL HIGH (ref 65–99)
Glucose-Capillary: 205 mg/dL — ABNORMAL HIGH (ref 65–99)

## 2016-04-02 MED ORDER — INSULIN ASPART PROT & ASPART (70-30 MIX) 100 UNIT/ML ~~LOC~~ SUSP
18.0000 [IU] | Freq: Two times a day (BID) | SUBCUTANEOUS | Status: DC
  Administered 2016-04-02 – 2016-04-03 (×2): 18 [IU] via SUBCUTANEOUS
  Filled 2016-04-02: qty 10

## 2016-04-02 MED ORDER — INSULIN ASPART 100 UNIT/ML ~~LOC~~ SOLN
0.0000 [IU] | Freq: Every day | SUBCUTANEOUS | Status: DC
  Administered 2016-04-02: 3 [IU] via SUBCUTANEOUS

## 2016-04-02 MED ORDER — INSULIN GLARGINE 100 UNIT/ML ~~LOC~~ SOLN: 22.0000 [IU] | Freq: Every day | SUBCUTANEOUS | Status: DC

## 2016-04-02 NOTE — Care Management Note (Signed)
Case Management Note  Patient Details  Name: Jacob GriceDano T Santos MRN: [id number] Date of Birth: June 03, 1971  Subjective/Objective:  45 y/o m admitted w/DKA. From home. No insurance, no pcp.Provided w/pcp listing, community resources,$4 [facility] med list.  Will contact [facility] liason Erskine SquibbJane to [facility]-await response.                Action/Plan:d/c plan home.   Expected Discharge Date:   (unknown)               Expected Discharge Plan:  Home/Self Care  In-House Referral:     Discharge planning Services  CM Consult, Indigent Health Clinic  Post Acute Care Choice:    Choice offered to:     DME Arranged:    DME Agency:     HH Arranged:    HH Agency:     Status of Service:  In process, will continue to follow  If discussed at Long Length of Stay Meetings, dates discussed:    Additional Comments:  Lanier ClamMahabir, Brynlee Pennywell, RN 04/02/2016, 2:40 PM

## 2016-04-02 NOTE — Telephone Encounter (Signed)
Message received from Lanier ClamKathy Mahabir, RN CM requesting [facility]. Unfortunately there are not any appointments available at this time. The patient will need to call the clinic to check on cancellations.   Update provided to K. Mahabir, RN CM

## 2016-04-02 NOTE — Care Management Note (Signed)
Case Management Note  Patient Details  Name: Jacob GriceDano T Santos MRN: [id number] Date of Birth: 12-10-1970  Subjective/Objective:  [facility] appt [facility]. [facility] @ d/c m-f 8a-4p.                  Action/Plan:d/c plan home.   Expected Discharge Date:   (unknown)               Expected Discharge Plan:  Home/Self Care  In-House Referral:     Discharge planning Services  CM Consult, Indigent Health Clinic  Post Acute Care Choice:    Choice offered to:     DME Arranged:    DME Agency:     HH Arranged:    HH Agency:     Status of Service:  In process, will continue to follow  If discussed at Long Length of Stay Meetings, dates discussed:    Additional Comments:  Lanier ClamMahabir, Mckade Gurka, RN 04/02/2016, 3:11 PM

## 2016-04-02 NOTE — Progress Notes (Signed)
PROGRESS NOTE    Jacob Santos  KYH:[id number] DOB: [birth year] DOA: 03/31/2016 PCP: No primary care provider on file.    Brief Narrative: Jacob Santos is a 45 y.o. male with medical history significant of HTN, not onmedications who presented with new onset DMII. Now on insulin and feeling much better.  Assessment & Plan:   Active Problems:   DKA, type 1 (HCC)   Hyponatremia   HTN (hypertension)   DKA (diabetic ketoacidoses) (HCC)  1-DKA, new diagnosis of Diabetes.  Presented with DKA, now resolved. He will need to be discharge on insulin.  HB A1c 9.8.  He was transition from insulin gtt to lantus, starting 70/30 today. 18 units BID.  Discussed that BG not under control, start 70/30 this PM and also added HS correction insulin. If sugars are controlled he can be discharged early tomorrow morning.  2-Hyponatremia; pseudohyponatremia secondary to hyperglycemia and also dehydration. IV fluids. Correction of DKA. Resolved.   3-Malignant HTN.  Blurry vision could be also related to DKA,hyperglycemia./  IV Hydralazine PRN .  Increased Norvasc 10 mg daily, now BP at goal.   4-Screening for HIV. Nonreactive.  5-GERD;  Pepcid.  6-Vision Changes. Report blurry vision improved. Is nearsighted, discussed with opthalmology, patient needs to follow up outpatient.    DVT prophylaxis: Lovenox.  Code Status:  Full code.  Family Communication: wife at bedside.  Disposition Plan: remain in the step down to controlled BP.    Consultants:   Opthalmology, via phone.   Procedures:   MRI 6/27 of brain, no acute process.  Antimicrobials:  None  Subjective: He feels well this AM. Wife at bedside. He hopes to go home today. Tolerating diet, no nausea, fevers.  Objective: Filed Vitals:   04/01/16 2111 04/02/16 0137 04/02/16 0405 04/02/16 0903  BP: 141/91 140/95 123/77 122/70  Pulse: 77 69 63 75  Temp: 98.3 F (36.8 C) 98 F (36.7 C) 98.3 F (36.8 C)   TempSrc: Oral Oral  Oral   Resp: Height:      Weight:      SpO2: 100% 100% 100%     Intake/Output Summary (Last 24 hours) at 04/02/16 1132 Last data filed at 04/02/16 1023  Gross per 24 hour  Intake    950 ml  Output    300 ml  Net    650 ml   Filed Weights   03/31/16 1133  Weight: 83.008 kg (183 lb)    Examination:  General exam: Appears calm and comfortable  Respiratory system: Clear to auscultation. Respiratory effort normal. Cardiovascular system: S1 & S2 heard, RRR. No JVD, murmurs, rubs, gallops or clicks. No pedal edema. Gastrointestinal system: Abdomen is nondistended, soft and nontender. No organomegaly or masses felt. Normal bowel sounds heard. Central nervous system: Alert and oriented. No focal neurological deficits. Extremities: Symmetric 5 x 5 power. Skin: No rashes, lesions or ulcers Psychiatry: Judgement and insight appear normal. Mood & affect appropriate.     Data Reviewed: I have personally reviewed following labs and imaging studies  CBC:  Recent Labs Lab 03/31/16 1155 04/02/16 0345  WBC 8.2 5.7  HGB 16.6 14.2  HCT 44.1 39.5  MCV 86.0 86.4  PLT 242 203   Basic Metabolic Panel:  Recent Labs Lab 03/31/16 2006 03/31/16 2322 04/01/16 0313 04/01/16 0617 04/02/16 0345  NA 138 136 1NAKSH RADI*  K 3.6 3.3* 3.7 3.9 4.0  CL 110 109 110 107 106  CO2 23 21* 21*  22 21*  GLUCOSE 88 259* 117* 185* 349*  BUN 15 16 16 15 18   CREATININE 0.71 0.78 0.68 0.89 0.79  CALCIUM 8.9 8.5* 8.5* 8.5* 8.8*   GFR: Estimated Creatinine Clearance: 117.9 mL/min (by C-G formula based on Cr of 0.79). Liver Function Tests: No results for input(s): AST, ALT, ALKPHOS, BILITOT, PROT, ALBUMIN in the last 168 hours. No results for input(s): LIPASE, AMYLASE in the last 168 hours. No results for input(s): AMMONIA in the last 168 hours. Coagulation Profile: No results for input(s): INR, PROTIME in the last 168 hours. Cardiac Enzymes:  Recent Labs Lab 03/31/16 2006  04/01/16 0313  TROPONINI <0.03 <0.03   BNP (last 3 results) No results for input(s): PROBNP in the last 8760 hours. HbA1C:  Recent Labs  03/31/16 1328  HGBA1C 9.8*   CBG:  Recent Labs Lab 04/01/16 1224 04/01/16 1626 04/01/16 2116 04/02/16 0744 04/02/16 1122  GLUCAP 284* 295* 346* 351* 284*   Lipid Profile: No results for input(s): CHOL, HDL, LDLCALC, TRIG, CHOLHDL, LDLDIRECT in the last 72 hours. Thyroid Function Tests: No results for input(s): TSH, T4TOTAL, FREET4, T3FREE, THYROIDAB in the last 72 hours. Anemia Panel: No results for input(s): VITAMINB12, FOLATE, FERRITIN, TIBC, IRON, RETICCTPCT in the last 72 hours. Sepsis Labs: No results for input(s): PROCALCITON, LATICACIDVEN in the last 168 hours.  Recent Results (from the past 240 hour(s))  MRSA PCR Screening     Status: None   Collection Time: 03/31/16  3:22 PM  Result Value Ref Range Status   MRSA by PCR NEGATIVE NEGATIVE Final    Comment:        The GeneXpert MRSA Assay (FDA approved for NASAL specimens only), is one component of a comprehensive MRSA colonization surveillance program. It is not intended to diagnose MRSA infection nor to guide or monitor treatment for MRSA infections.          Radiology Studies: Dg Chest 2 View  03/31/2016  CLINICAL DATA:  Blurred vision for 2 days, diabetes mellitus, frequent urination, hypertension, smoker EXAM: CHEST  2 VIEW COMPARISON:  02/07/2004 FINDINGS: Upper normal heart size. Mediastinal contours and pulmonary vascularity normal. Lungs clear. No pleural effusion or pneumothorax. Minimal degenerative changes RIGHT glenohumeral joint. No acute osseous findings. Jewelry artifacts at the nipples bilaterally. IMPRESSION: No acute abnormalities. Electronically Signed   By: Ulyses SouthwardMark  Boles M.D.   On: 03/31/2016 15:09   Mr Brain Wo Contrast  04/01/2016  CLINICAL DATA:  Blurred vision, polyuria, polydipsia, and dizziness. New diagnosis of diabetes. Malignant  hypertension. EXAM: MRI HEAD WITHOUT CONTRAST TECHNIQUE: Multiplanar, multiecho pulse sequences of the brain and surrounding structures were obtained without intravenous contrast. COMPARISON:  None. FINDINGS: There is no evidence of acute infarct, intracranial hemorrhage, mass, midline shift, or extra-axial fluid collection. Ventricles are normal in size. Sulci are slightly prominent in size for age and may reflect early cerebral atrophy. No significant cerebral white matter disease is seen, and there is no evidence of posterior reversible encephalopathy syndrome. Orbits are unremarkable. The paranasal sinuses are clear. There is a trace right mastoid effusion. Major intracranial vascular flow voids are preserved. IMPRESSION: 1. No acute intracranial abnormality. 2. Borderline to mild cerebral atrophy. Electronically Signed   By: Sebastian AcheAllen  Grady M.D.   On: 04/01/2016 12:15        Scheduled Meds: . amLODipine  10 mg Oral Daily  . enoxaparin (LOVENOX) injection  40 mg Subcutaneous Q24H  . famotidine  20 mg Oral BID  . insulin aspart  0-15  Units Subcutaneous TID WC  . insulin aspart  0-5 Units Subcutaneous QHS  . insulin aspart  3 Units Subcutaneous TID WC  . insulin aspart protamine- aspart  18 Units Subcutaneous BID WC   Continuous Infusions:     LOS: 2 days    Time spent: 35 minutes.     Mir Vergie LivingMohammed Ikramullah, MD [facility] Pager [phone]  If 7PM-7AM, please contact night-coverage [email or web address] Password [email or web address] 04/02/2016, 11:32 AM

## 2016-04-03 LAB — GLUCOSE, CAPILLARY: GLUCOSE-CAPILLARY: 267 mg/dL — AB (ref 65–99)

## 2016-04-03 MED ORDER — BLOOD GLUCOSE METER KIT: PACK | Status: AC

## 2016-04-03 MED ORDER — INSULIN ASPART 100 UNIT/ML ~~LOC~~ SOLN: 3.0000 [IU] | Freq: Three times a day (TID) | SUBCUTANEOUS | Status: DC

## 2016-04-03 MED ORDER — INSULIN ASPART 100 UNIT/ML ~~LOC~~ SOLN: 0.0000 [IU] | Freq: Three times a day (TID) | SUBCUTANEOUS | Status: DC

## 2016-04-03 MED ORDER — INSULIN ASPART 100 UNIT/ML ~~LOC~~ SOLN: 0.0000 [IU] | Freq: Every day | SUBCUTANEOUS | Status: DC

## 2016-04-03 MED ORDER — INSULIN ASPART PROT & ASPART (70-30 MIX) 100 UNIT/ML ~~LOC~~ SUSP: 20.0000 [IU] | Freq: Two times a day (BID) | SUBCUTANEOUS | Status: DC

## 2016-04-03 MED ORDER — AMLODIPINE BESYLATE 10 MG PO TABS: 10.0000 mg | ORAL_TABLET | Freq: Every day | ORAL | Status: DC

## 2016-04-03 MED ORDER — INSULIN ASPART PROT & ASPART (70-30 MIX) 100 UNIT/ML ~~LOC~~ SUSP: 24.0000 [IU] | Freq: Two times a day (BID) | SUBCUTANEOUS | Status: DC

## 2016-04-03 MED ORDER — INSULIN REGULAR HUMAN 100 UNIT/ML IJ SOLN: 0.0000 [IU] | Freq: Three times a day (TID) | INTRAMUSCULAR | Status: DC

## 2016-04-03 MED ORDER — "INSULIN SYRINGE 31G X 5/16"" 1 ML MISC": Status: DC

## 2016-04-03 MED ORDER — NICOTINE 14 MG/24HR TD PT24: 14.0000 mg | MEDICATED_PATCH | Freq: Every day | TRANSDERMAL | Status: DC | PRN

## 2016-04-03 NOTE — Discharge Summary (Signed)
Discharge Summary  Jacob Santos HBZ:[id number] DOB: 02/13/1971  PCP: No primary care provider on file.  Admit date: 03/31/2016 Discharge date: 04/03/2016   Recommendations for Outpatient Follow-up:  1. PCP 1-2 weeks, at [facility] cell clinic. 2. Ophthalmology as outpt for blurry vision (improving)   Discharge Diagnoses:  Active Hospital Problems   Diagnosis Date Noted  . DKA, type 1 ([place]) 03/31/2016  . Hyponatremia 03/31/2016  . HTN (hypertension) 03/31/2016  . DKA (diabetic ketoacidoses) ([place]) 03/31/2016    Resolved Hospital Problems   Diagnosis Date Noted Date Resolved  No resolved problems to display.    Discharge Condition: Stable   Diet recommendation: Diabetic   Filed Vitals:   04/03/16 0455 04/03/16 0748  BP: 126/83 130/80  Pulse: 78 71  Temp: 98 F (36.7 C) 97.9 F (36.6 C)  Resp: 18 16    History of present illness:  Jacob Santos is a 45 y.o. male with medical history significant of HTN, not on medications who presented with new onset DMII. Now has had diabetic education, started on 70/30 and sliding scale and is feeling much better. Ready to go home today.  Hospital Course  1-DKA, new diagnosis of Diabetes.  Presented with DKA, now resolved. HB A1c 9.8.  Started on insulin.  He was transition from insulin gtt to lantus, started on 70/30 6/28, on 24 units BID as well as sliding scale insulin.  2-Hyponatremia; pseudohyponatremia secondary to hyperglycemia and also dehydration. IV fluids. Correction of DKA. Resolved.   3-Malignant HTN.  Blurry vision could be also related to DKA,hyperglycemia./  IV Hydralazine PRN .  Increased Norvasc 10 mg daily, now BP at goal.   4-Screening for HIV. Nonreactive.  5-GERD; Pepcid.  6-Vision Changes. Report blurry vision improved. Is nearsighted, discussed with opthalmology, patient needs to follow up outpatient. Had MRI in house that was unremarkable.  Procedures:  None   Consultations:  None    Discharge Exam: BP 130/80 mmHg  Pulse 71  Temp(Src) 97.9 F (36.6 C) (Oral)  Resp 16  Ht '5\' 6"'  (1.676 m)  Wt 83.008 kg (183 lb)  BMI 29.55 kg/m2  SpO2 99% General:  Alert, oriented, calm, in no acute distress  Eyes: pupils round and reactive to light and accomodation, clear sclerea Neck: supple, no masses, trachea mildline  Cardiovascular: RRR, no murmurs or rubs, no peripheral edema  Respiratory: clear to auscultation bilaterally, no wheezes, no crackles  Abdomen: soft, nontender, nondistended, normal bowel tones heard  Skin: dry, no rashes  Musculoskeletal: no joint effusions, normal range of motion  Psychiatric: appropriate affect, normal speech  Neurologic: extraocular muscles intact, clear speech, moving all extremities with intact sensorium    Discharge Instructions You were cared for by a hospitalist during your hospital stay. If you have any questions about your discharge medications or the care you received while you were in the hospital after you are discharged, you can call the unit and asked to speak with the hospitalist on call if the hospitalist that took care of you is not available. Once you are discharged, your primary care physician will handle any further medical issues. Please note that NO REFILLS for any discharge medications will be authorized once you are discharged, as it is imperative that you return to your primary care physician (or establish a relationship with a primary care physician if you do not have one) for your aftercare needs so that they can reassess your need for medications and monitor your lab values.  Discharge Instructions  Ambulatory referral to Nutrition and Diabetic Education    Complete by:  As directed      Diet - low sodium heart healthy    Complete by:  As directed      Discharge instructions    Complete by:  As directed   Follow up with PCP at [facility].     Increase activity slowly    Complete by:  As directed              Medication List    STOP taking these medications        diphenhydrAMINE 25 MG tablet  Commonly known as:  BENADRYL     famotidine 20 MG tablet  Commonly known as:  PEPCID     predniSONE 20 MG tablet  Commonly known as:  DELTASONE      TAKE these medications        amLODipine 10 MG tablet  Commonly known as:  NORVASC  Take 1 tablet (10 mg total) by mouth daily.     blood glucose meter kit and supplies  Dispense based on patient and insurance preference. Use up to four times daily as directed. (FOR ICD-9 250.00, 250.01).     ibuprofen 200 MG tablet  Commonly known as:  ADVIL,MOTRIN  Take 600 mg by mouth every 6 (six) hours as needed for fever, headache, mild pain, moderate pain or cramping.     insulin aspart 100 UNIT/ML injection  Commonly known as:  novoLOG  Inject 0-5 Units into the skin at bedtime.     insulin aspart protamine- aspart (70-30) 100 UNIT/ML injection  Commonly known as:  NOVOLOG MIX 70/30  Inject 0.24 mLs (24 Units total) into the skin 2 (two) times daily with a meal.     insulin regular 100 units/mL injection  Commonly known as:  NOVOLIN R,HUMULIN R  Inject 0-0.15 mLs (0-15 Units total) into the skin 3 (three) times daily before meals.     INSULIN SYRINGE 1CC/31GX5/16" 31G X 5/16" 1 ML Misc  For Novolog and 70/30 insulin     nicotine 14 mg/24hr patch  Commonly known as:  NICODERM CQ - dosed in mg/24 hours  Place 1 patch (14 mg total) onto the skin daily as needed (smoking cessation).       Allergies  Allergen Reactions  . Penicillins     Unknown reaction  Has patient had a PCN reaction causing immediate rash, facial/tongue/throat swelling, SOB or lightheadedness with hypotension: unknown Has patient had a PCN reaction causing severe rash involving mucus membranes or skin necrosis: unknown Has patient had a PCN reaction that required hospitalization: unknown Has patient had a PCN reaction occurring within the last 10 years: unknown If  all of the above answers are "NO", then may proceed with Cephalosporin use.        Follow-up Information    Follow up with Please use the resources provided to you in emergency room by case manager to assist you're your choice of doctor for follow up .   Why:  A referral for you has been sent to [facility] if you have not received a call in 3 days you may contact them Call Sylvie Farrier at [place] [email or web address]   Contact information:   These [place] uninsured resources provide possible primary care providers, resources for discounted medications, housing, dental resources, affordable care act information, plus other resources for [employer]        Follow up  with Mack Hook, MD. Daphane Shepherd on 04/29/2016.   Specialty:  Internal Medicine   Why:  You have a scheduled appointment to establish care with Dr Amil Amen at [facility] clinic on 04/29/16 at 2 pm Please bring medication list, fee and discharge instructions You may also follow up on the status of any orange card application in this office    Contact information:   [place] [address] [phone]       Follow up with Wyatt Portela, MD In 1 week.   Specialty:  Ophthalmology   Contact information:   [address] [place] [address] [phone]       Follow up with [place] On 05/12/2016.   Why:  11am   Contact information:   [place] [phone]       Please follow up.   Why:  @ d/c m-f 8a-4p-go to [facility]-[address]       The results of significant diagnostics from this hospitalization (including imaging, microbiology, ancillary and laboratory) are listed below for reference.    Significant Diagnostic Studies: Dg Chest 2 View  03/31/2016  CLINICAL DATA:  Blurred vision for 2 days, diabetes mellitus, frequent urination, hypertension, smoker  EXAM: CHEST  2 VIEW COMPARISON:  02/07/2004 FINDINGS: Upper normal heart size. Mediastinal contours and pulmonary vascularity normal. Lungs clear. No pleural effusion or pneumothorax. Minimal degenerative changes RIGHT glenohumeral joint. No acute osseous findings. Jewelry artifacts at the nipples bilaterally. IMPRESSION: No acute abnormalities. Electronically Signed   By: Lavonia Dana M.D.   On: 03/31/2016 15:09   Mr Brain Wo Contrast  04/01/2016  CLINICAL DATA:  Blurred vision, polyuria, polydipsia, and dizziness. New diagnosis of diabetes. Malignant hypertension. EXAM: MRI HEAD WITHOUT CONTRAST TECHNIQUE: Multiplanar, multiecho pulse sequences of the brain and surrounding structures were obtained without intravenous contrast. COMPARISON:  None. FINDINGS: There is no evidence of acute infarct, intracranial hemorrhage, mass, midline shift, or extra-axial fluid collection. Ventricles are normal in size. Sulci are slightly prominent in size for age and may reflect early cerebral atrophy. No significant cerebral white matter disease is seen, and there is no evidence of posterior reversible encephalopathy syndrome. Orbits are unremarkable. The paranasal sinuses are clear. There is a trace right mastoid effusion. Major intracranial vascular flow voids are preserved. IMPRESSION: 1. No acute intracranial abnormality. 2. Borderline to mild cerebral atrophy. Electronically Signed   By: Logan Bores M.D.   On: 04/01/2016 12:15    Microbiology: Recent Results (from the past 240 hour(s))  MRSA PCR Screening     Status: None   Collection Time: 03/31/16  3:22 PM  Result Value Ref Range Status   MRSA by PCR NEGATIVE NEGATIVE Final    Comment:        The GeneXpert MRSA Assay (FDA approved for NASAL specimens only), is one component of a comprehensive MRSA colonization surveillance program. It is not intended to diagnose MRSA infection nor to guide or monitor treatment for MRSA infections.      Labs: Basic  Metabolic Panel:  Recent Labs Lab 03/31/16 2006 03/31/16 2322 04/01/16 0313 04/01/16 0617 04/02/16 0345  NA 138 136 137 136 133*  K 3.6 3.3* 3.7 3.9 4.0  CL 110 109 110 107 106  CO2 23 21* 21* 22 21*  GLUCOSE 88 259* 117* 185* 349*  BUN '15 16 16 15 18  ' CREATININE 0.71 0.78 0.68 0.89  0.79  CALCIUM 8.9 8.5* 8.5* 8.5* 8.8*   Liver Function Tests: No results for input(s): AST, ALT, ALKPHOS, BILITOT, PROT, ALBUMIN in the last 168 hours. No results for input(s): LIPASE, AMYLASE in the last 168 hours. No results for input(s): AMMONIA in the last 168 hours. CBC:  Recent Labs Lab 03/31/16 1155 04/02/16 0345  WBC 8.2 5.7  HGB 16.6 14.2  HCT 44.1 39.5  MCV 86.0 86.4  PLT 242 203   Cardiac Enzymes:  Recent Labs Lab 03/31/16 2006 04/01/16 0313  TROPONINI <0.03 <0.03   BNP: BNP (last 3 results) No results for input(s): BNP in the last 8760 hours.  ProBNP (last 3 results) No results for input(s): PROBNP in the last 8760 hours.  CBG:  Recent Labs Lab 04/02/16 0744 04/02/16 1122 04/02/16 1620 04/02/16 2202 04/03/16 0730  GLUCAP 351* 284* 205* 254* 267*    Time spent: 35 minutes were spent in preparing this discharge including medication reconciliation, counseling, and coordination of care.  Signed:  Mir Marry Guan  [facility] 04/03/2016, 12:49 PM

## 2016-04-03 NOTE — Progress Notes (Signed)
Inpatient Diabetes Program Recommendations  AACE/ADA: New Consensus Statement on Inpatient Glycemic Control (2015)  Target Ranges:  Prepandial:   less than 140 mg/dL      Peak postprandial:   less than 180 mg/dL (1-2 hours)      Critically ill patients:  140 - 180 mg/dL   Lab Results  Component Value Date   GLUCAP 267* 04/03/2016   HGBA1C 9.8* 03/31/2016   Results for Jacob Santos, [place] T (MRN [id number]) as of 04/03/2016 09:33  Ref. Range 04/02/2016 07:44 04/02/2016 11:22 04/02/2016 16:20 04/02/2016 22:02 04/03/2016 07:30  Glucose-Capillary Latest Ref Range: 65-99 mg/dL 621351 (H) 308284 (H) 657205 (H) 254 (H) 267 (H)   Review of Glycemic Control  Blood sugars still elevated in 200s. Needs insulin adjustment prior to discharge.  Inpatient Diabetes Program Recommendations:    Increase ReliOn 70/30 to 24 units bid D/C Novolog 3 units tid (no meal coverage insulin needed with 70/30 since it's already built in) D/C Novolog s/s and order ReliOn Regular insulin - same sliding scale.  Pt instructed to take insulin approximately 30 min prior to meals. Call MD if blood sugars are consistently > 250 mg/dL for insulin adjustments.  MD paged regarding above recommendations.  Thank you. Ailene Ardshonda Jermia Rigsby, RD, LDN, CDE Inpatient Diabetes Coordinator [phone]

## 2016-04-03 NOTE — Care Management Note (Signed)
Case Management Note  Patient Details  Name: Jacob Santos MRN: [id number] Date of Birth: 06/04/1971  Subjective/Objective:  D/C home no further CM needs.                  Action/Plan:d/c home.   Expected Discharge Date:   (unknown)               Expected Discharge Plan:  Home/Self Care  In-House Referral:     Discharge planning Services  CM Consult, Indigent Health Clinic  Post Acute Care Choice:    Choice offered to:     DME Arranged:    DME Agency:     HH Arranged:    HH Agency:     Status of Service:  Completed, signed off  If discussed at [employer] Length of Stay Meetings, dates discussed:    Additional Comments:  Lanier ClamMahabir, Lorren Rossetti, RN 04/03/2016, 9:23 AM

## 2016-04-29 ENCOUNTER — Ambulatory Visit: Payer: Self-pay | Admitting: Internal Medicine

## 2016-05-07 ENCOUNTER — Ambulatory Visit: Payer: Self-pay | Admitting: *Deleted

## 2016-05-12 ENCOUNTER — Ambulatory Visit: Payer: Self-pay | Admitting: Family Medicine

## 2016-05-23 ENCOUNTER — Encounter: Payer: Self-pay | Attending: Internal Medicine | Admitting: *Deleted

## 2016-05-23 NOTE — Patient Instructions (Signed)
Plan:  Continue with your current eating habits of 3 meals and healthy snacks each day We will do more instruction on meal planning at your next visit Continue with your activity level daily as tolerated Consider checking BG at alternate times per day  When you are able to see your new Primary Care Doctor, they will determine if you still need insulin or not, and write the correct Rx for any diabetes medications you need.  You will need a Rx for more stips

## 2016-06-13 ENCOUNTER — Ambulatory Visit: Payer: Self-pay | Admitting: Family Medicine

## 2016-06-25 ENCOUNTER — Encounter: Payer: Self-pay | Attending: Internal Medicine | Admitting: *Deleted

## 2016-06-25 DIAGNOSIS — E1010 Type 1 diabetes mellitus with ketoacidosis without coma: Secondary | ICD-10-CM | POA: Insufficient documentation

## 2016-06-25 DIAGNOSIS — E119 Type 2 diabetes mellitus without complications: Secondary | ICD-10-CM

## 2016-06-25 NOTE — Progress Notes (Signed)
Diabetes Self-Management Education  Visit Type:  Follow-up  Appt. Start Time: 1130 Appt. End Time: 1230  06/25/2016  Jacob Santos, identified by name and date of birth, is a 45 y.o. male with a diagnosis of Diabetes: Type 2. He is here with his fiancee who appears supportive. She participated in the visit when asked. He is here for carb counting instruction as that was not completed at his last visit.    ASSESSMENT  Weight 199 lb 1.2 oz (90.3 kg). Body mass index is 32.13 kg/m.       Diabetes Self-Management Education - 06/25/16 1234      Initial Visit   What type of meal plan do you follow? no fried foods, more high fiber foods     Psychosocial Assessment   Patient Belief/Attitude about Diabetes Motivated to manage diabetes   Self-care barriers None   Self-management support Family   Patient Concerns Nutrition/Meal planning;Glycemic Control     Complications   Last HgB A1C per patient/outside source 9.8 %   How often do you check your blood sugar? 3-4 times / week   Fasting Blood glucose range (mg/dL) 16-10970-129   Postprandial Blood glucose range (mg/dL) 60-45470-129     Patient Self-Evaluation of Goals - Patient rates self as meeting previously set goals (% of time)   Nutrition 50 - 75 %   Physical Activity >75%   Medications 25 - 50%   Monitoring 50 - 75 %   Problem Solving 50 - 75 %   Reducing Risk 50 - 75 %   Health Coping >75%     Post-Education Assessment   Patient understands the diabetes disease and treatment process. Demonstrates understanding / competency   Patient understands incorporating nutritional management into lifestyle. Needs Review   Patient undertands incorporating physical activity into lifestyle. Demonstrates understanding / competency   Patient understands using medications safely. Needs Review   Patient understands monitoring blood glucose, interpreting and using results Needs Review   Patient understands prevention, detection, and treatment of  chronic complications. Demonstrates understanding / competency     Outcomes   Program Status Completed     Subsequent Visit   Since your last visit, are you checking your blood glucose at least once a day? Yes      Learning Objective:  Patient will have a greater understanding of diabetes self-management. Patient education plan is to attend individual and/or group sessions per assessed needs and concerns.   Plan:   Patient Instructions  Plan:  Aim for 4 Carb Choices per meal (60 grams) +/- 1 either way  Aim for 0-2 Carbs per snack if hungry  Include protein in moderation with your meals and snacks Consider reading food labels for Total Carbohydrate of foods Continue with your activity level daily as tolerated Consider checking BG at alternate times per day   When you are able to see your new Primary Care Doctor, they will determine if you still need insulin or not, and write the correct Rx for any diabetes medications you need.  You will need a Rx for more strips      Expected Outcomes:  Demonstrated interest in learning. Expect positive outcomes  Education material provided: Meal plan card and Carbohydrate counting sheet  If problems or questions, patient to contact team via:  Phone and Email  Future DSME appointment: - PRN

## 2016-06-25 NOTE — Patient Instructions (Addendum)
Plan:  Aim for 4 Carb Choices per meal (60 grams) +/- 1 either way  Aim for 0-2 Carbs per snack if hungry  Include protein in moderation with your meals and snacks Consider reading food labels for Total Carbohydrate of foods Continue with your activity level daily as tolerated Consider checking BG at alternate times per day   When you are able to see your new Primary Care Doctor, they will determine if you still need insulin or not, and write the correct Rx for any diabetes medications you need.  You will need a Rx for more strips

## 2016-07-03 ENCOUNTER — Ambulatory Visit: Payer: Self-pay | Admitting: Family Medicine

## 2019-11-16 ENCOUNTER — Ambulatory Visit
Admission: RE | Admit: 2019-11-16 | Discharge: 2019-11-16 | Disposition: A | Discharge status: Home / Self Care | Payer: BC Managed Care – PPO | Source: Ambulatory Visit | Attending: Family Medicine | Admitting: Family Medicine

## 2019-11-16 ENCOUNTER — Other Ambulatory Visit: Payer: Self-pay | Admitting: Family Medicine

## 2019-11-16 DIAGNOSIS — M25561 Pain in right knee: Secondary | ICD-10-CM

## 2021-05-27 IMAGING — CR DG KNEE COMPLETE 4+V*R*
4 series · 4 of 4 positions shown · non-contrast
Comparison: None.

CLINICAL DATA: Bilateral knee pain

EXAM:
RIGHT KNEE - COMPLETE 4+ VIEW

[t knee ap right]
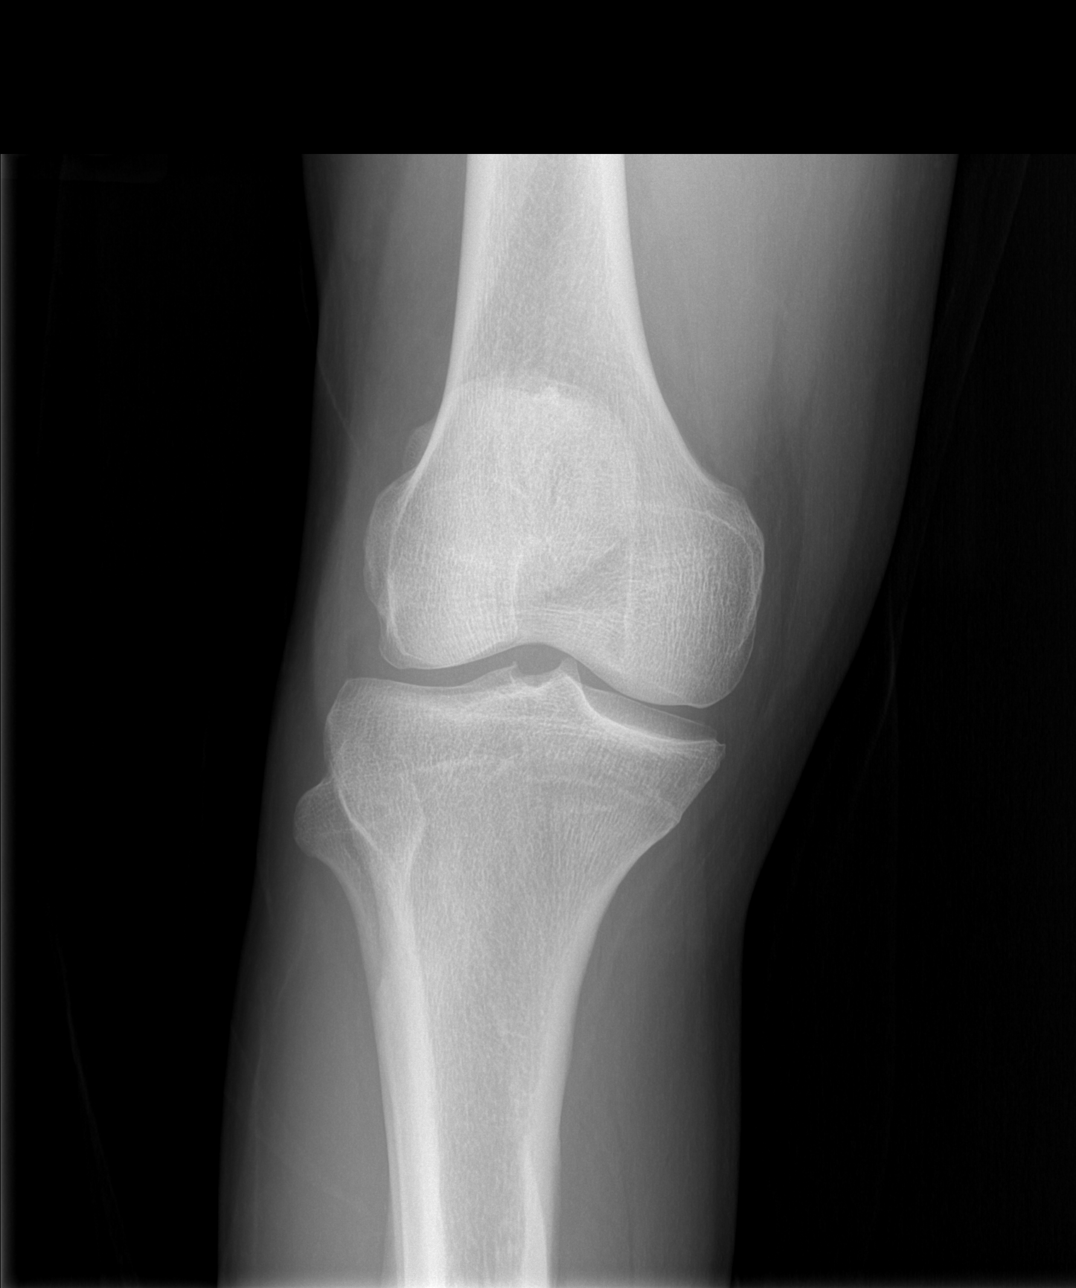

[t knee oblique right (1 of 2)]
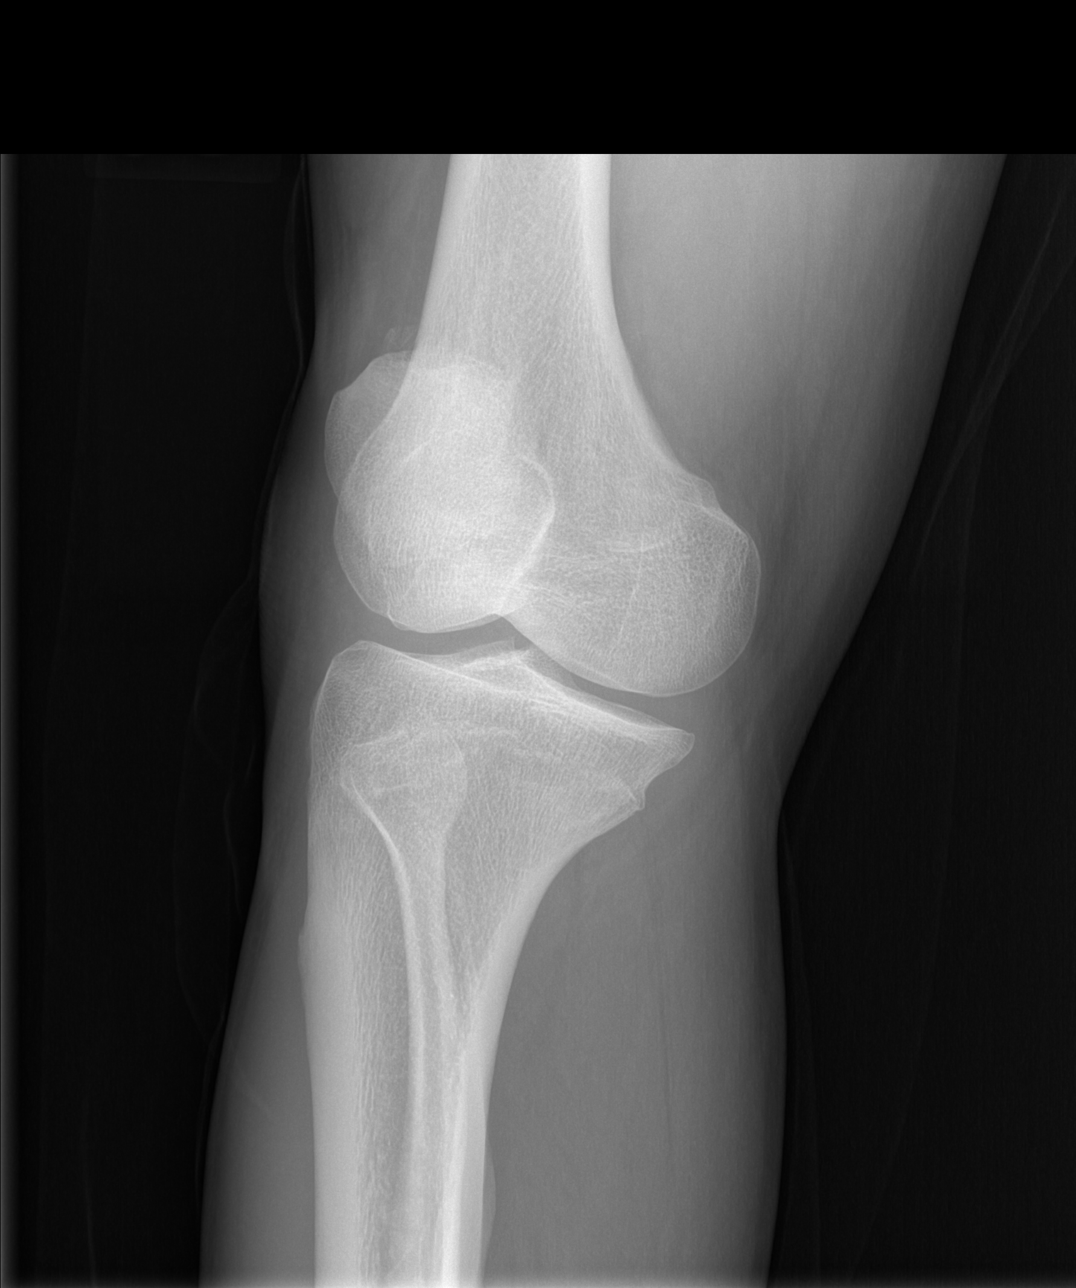

[t knee oblique right (2 of 2)]
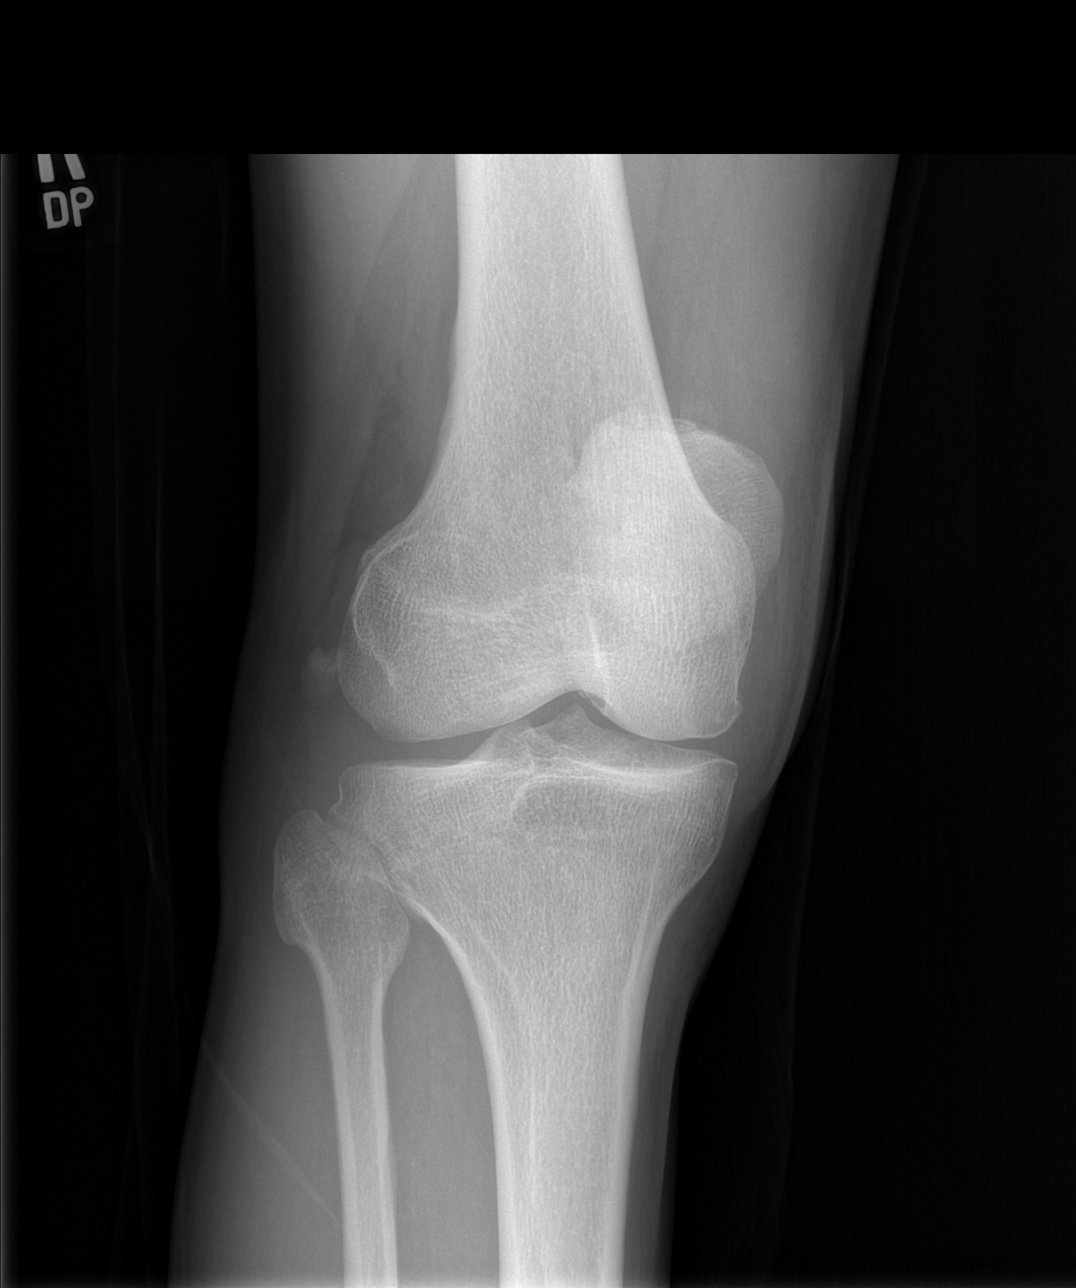

[t knee lat right]
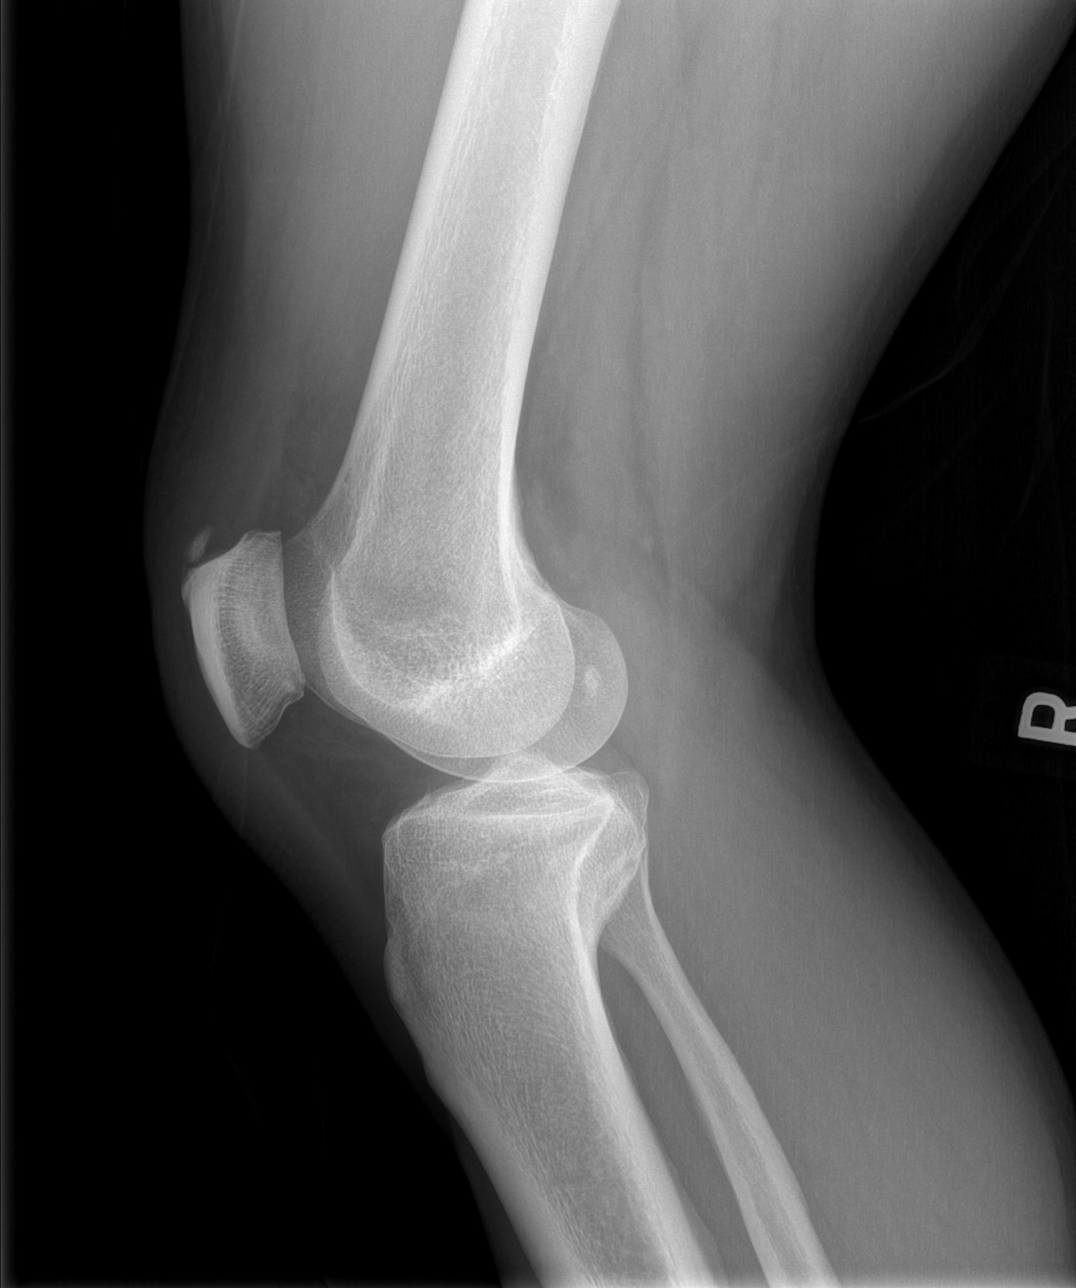

[4 of 4 positions shown; findings below may reference images not displayed]

FINDINGS: No evidence of fracture, dislocation, or joint effusion. No evidence
of arthropathy or other focal bone abnormality. Soft tissues are
unremarkable.
IMPRESSION: No acute abnormality noted.

## 2022-09-10 ENCOUNTER — Encounter (HOSPITAL_COMMUNITY): Payer: Self-pay

## 2022-09-10 ENCOUNTER — Other Ambulatory Visit: Payer: Self-pay

## 2022-09-10 ENCOUNTER — Emergency Department (HOSPITAL_COMMUNITY)
Admission: EM | Admit: 2022-09-10 | Discharge: 2022-09-11 | Disposition: A | Discharge status: Home / Self Care | Payer: Self-pay | Attending: Emergency Medicine | Admitting: Emergency Medicine

## 2022-09-10 ENCOUNTER — Emergency Department (HOSPITAL_COMMUNITY): Payer: Self-pay

## 2022-09-10 DIAGNOSIS — Z794 Long term (current) use of insulin: Secondary | ICD-10-CM | POA: Insufficient documentation

## 2022-09-10 DIAGNOSIS — R42 Dizziness and giddiness: Secondary | ICD-10-CM | POA: Insufficient documentation

## 2022-09-10 DIAGNOSIS — R55 Syncope and collapse: Secondary | ICD-10-CM | POA: Insufficient documentation

## 2022-09-10 HISTORY — DX: Type 2 diabetes mellitus without complications: E11.9

## 2022-09-10 LAB — CBC WITH DIFFERENTIAL/PLATELET
Abs Immature Granulocytes: 0.07 10*3/uL (ref 0.00–0.07)
Basophils Absolute: 0 10*3/uL (ref 0.0–0.1)
Basophils Relative: 0 %
Eosinophils Absolute: 0.1 10*3/uL (ref 0.0–0.5)
Eosinophils Relative: 0 %
HCT: 34.5 % — ABNORMAL LOW (ref 39.0–52.0)
Hemoglobin: 11.8 g/dL — ABNORMAL LOW (ref 13.0–17.0)
Immature Granulocytes: 1 %
Lymphocytes Relative: 12 %
Lymphs Abs: 1.7 10*3/uL (ref 0.7–4.0)
MCH: 33 pg (ref 26.0–34.0)
MCHC: 34.2 g/dL (ref 30.0–36.0)
MCV: 96.4 fL (ref 80.0–100.0)
Monocytes Absolute: 0.8 10*3/uL (ref 0.1–1.0)
Monocytes Relative: 6 %
Neutro Abs: 11.2 10*3/uL — ABNORMAL HIGH (ref 1.7–7.7)
Neutrophils Relative %: 81 %
Platelets: 196 10*3/uL (ref 150–400)
RBC: 3.58 MIL/uL — ABNORMAL LOW (ref 4.22–5.81)
RDW: 11.5 % (ref 11.5–15.5)
WBC: 13.8 10*3/uL — ABNORMAL HIGH (ref 4.0–10.5)
nRBC: 0 % (ref 0.0–0.2)

## 2022-09-10 LAB — COMPREHENSIVE METABOLIC PANEL
ALT: 9 U/L (ref 0–44)
AST: 16 U/L (ref 15–41)
Albumin: 3.4 g/dL — ABNORMAL LOW (ref 3.5–5.0)
Alkaline Phosphatase: 54 U/L (ref 38–126)
Anion gap: 9 (ref 5–15)
BUN: 13 mg/dL (ref 6–20)
CO2: 22 mmol/L (ref 22–32)
Calcium: 8.1 mg/dL — ABNORMAL LOW (ref 8.9–10.3)
Chloride: 111 mmol/L (ref 98–111)
Creatinine, Ser: 1.02 mg/dL (ref 0.61–1.24)
GFR, Estimated: >60 mL/min (ref >60)
Glucose, Bld: 160 mg/dL — ABNORMAL HIGH (ref 70–99)
Potassium: 3.7 mmol/L (ref 3.5–5.1)
Sodium: 142 mmol/L (ref 135–145)
Total Bilirubin: 1.1 mg/dL (ref 0.3–1.2)
Total Protein: 5.7 g/dL — ABNORMAL LOW (ref 6.5–8.1)

## 2022-09-10 LAB — TROPONIN I (HIGH SENSITIVITY): Troponin I (High Sensitivity): 4 ng/L (ref <18)

## 2022-09-10 LAB — CBG MONITORING, ED: Glucose-Capillary: 170 mg/dL — ABNORMAL HIGH (ref 70–99)

## 2022-09-10 NOTE — ED Provider Notes (Cosign Needed)
[facility] Provider Note   CSN: [id number] Arrival date & time: 09/10/22  1953     History  Chief Complaint  Patient presents with   Dizziness    Jacob Santos is a 51 y.o. male.  Pt's wife reports that pt had a syncopal episode at dinner tonight.  Pt was at [place] and was getting ready to stand up to get his child food.  Pt is reported to have passed out for 2 minutes.  Pt's wife reports pt was not breathing.  Pt reports he feels normal now.  Pt denies headache or chest pain.  Pt fever or chills.  No cough or congestion.    The history is provided by the patient and the spouse. No language interpreter was used.  Dizziness Onset quality:  Sudden Progression:  Resolved Chronicity:  New Context: loss of consciousness   Context: not with bowel movement, not with eye movement and not with head movement   Relieved by:  Nothing Worsened by:  Nothing Associated symptoms: palpitations   Associated symptoms: no chest pain, no diarrhea and no vomiting   Risk factors: no anemia and no heart disease        Home Medications Prior to Admission medications   Medication Sig Start Date End Date Taking? Authorizing Provider  amLODipine (NORVASC) 10 MG tablet Take 1 tablet (10 mg total) by mouth daily. 04/03/16   Hollice Gong, Mir Earlie Server, MD  blood glucose meter kit and supplies Dispense based on patient and insurance preference. Use up to four times daily as directed. (FOR ICD-9 250.00, 250.01). 04/03/16   Hollice Gong, Mir Earlie Server, MD  ibuprofen (ADVIL,MOTRIN) 200 MG tablet Take 600 mg by mouth every 6 (six) hours as needed for fever, headache, mild pain, moderate pain or cramping.    [provider]  insulin aspart (NOVOLOG) 100 UNIT/ML injection Inject 0-5 Units into the skin at bedtime. 04/03/16   Hollice Gong, Mir Mohammed, MD  insulin aspart protamine- aspart (NOVOLOG MIX 70/30) (70-30) 100 UNIT/ML injection Inject 0.24 mLs (24 Units  total) into the skin 2 (two) times daily with a meal. 04/03/16   Hollice Gong, Mir Mohammed, MD  insulin regular (NOVOLIN R,HUMULIN R) 100 units/mL injection Inject 0-0.15 mLs (0-15 Units total) into the skin 3 (three) times daily before meals. 04/03/16   Hollice Gong, Mir Earlie Server, MD  Insulin Syringe-Needle U-100 (INSULIN SYRINGE 1CC/31GX5/16") 31G X 5/16" 1 ML MISC For Novolog and 70/30 insulin 04/03/16   Hollice Gong, Mir Mohammed, MD  nicotine (NICODERM CQ - DOSED IN MG/24 HOURS) 14 mg/24hr patch Place 1 patch (14 mg total) onto the skin daily as needed (smoking cessation). 04/03/16   Tomma Rakers, MD      Allergies    Penicillins    Review of Systems   Review of Systems  Cardiovascular:  Positive for palpitations. Negative for chest pain.  Gastrointestinal:  Negative for diarrhea and vomiting.  Neurological:  Positive for dizziness.  All other systems reviewed and are negative.   Physical Exam Updated Vital Signs BP 138/79 (BP Location: Right Arm)   Pulse 83   Temp 97.6 F (36.4 C) (Oral)   Resp (!) 23   Ht _0  (1.676 m)   Wt 84.4 kg   SpO2 100%   BMI 30.02 kg/m  Physical Exam Vitals and nursing note reviewed.  Constitutional:      Appearance: He is well-developed.  HENT:     Head: Normocephalic.     Mouth/Throat:  Mouth: Mucous membranes are moist.  Eyes:     Extraocular Movements: Extraocular movements intact.     Pupils: Pupils are equal, round, and reactive to light.  Cardiovascular:     Rate and Rhythm: Normal rate.  Pulmonary:     Effort: Pulmonary effort is normal.  Abdominal:     General: Abdomen is flat. There is no distension.  Musculoskeletal:        General: Normal range of motion.     Cervical back: Normal range of motion.  Skin:    General: Skin is warm.  Neurological:     General: No focal deficit present.     Mental Status: He is alert and oriented to person, place, and time.  Psychiatric:        Mood and Affect: Mood normal.     ED Results / Procedures / Treatments   Labs (all labs ordered are listed, but only abnormal results are displayed) Labs Reviewed  CBG MONITORING, ED - Abnormal; Notable for the following components:      Result Value   Glucose-Capillary 170 (*)    All other components within normal limits  CBC WITH DIFFERENTIAL/PLATELET  COMPREHENSIVE METABOLIC PANEL  CBG MONITORING, ED  TROPONIN I (HIGH SENSITIVITY)    EKG None  Radiology No results found.  Procedures Procedures    Medications Ordered in ED Medications - No data to display  ED Course/ Medical Decision Making/ A&P                           Medical Decision Making Pt's spouse reports pt passed out at the restaurant where they were having dinner.  She reports pt was unconscious for 2 minutes and not breathing   Amount and/or Complexity of Data Reviewed Independent Historian: spouse    Details: Pt here with spouse.  She provides histroy  Labs: ordered. Radiology: ordered.           Final Clinical Impression(s) / ED Diagnoses Final diagnoses:  Syncope with normal neurologic examination    Rx / DC Orders ED Discharge Orders     None         Fransico Meadow, [place] 09/10/22 2319

## 2022-09-10 NOTE — ED Triage Notes (Signed)
BIB [facility] from [facility]. C/o dizziness, about to eat dinner started to feeling hot, lightheaded, nausea, felt like he had to pass out. Spouse called 911. Pt has not eaten since yesterday and is a diabetic, can't remember last time he had water. Initial bp 90/40. Total of of NS. PIV 18ga lt ac. Pt had vomiting episode that was bile content

## 2022-09-11 LAB — TROPONIN I (HIGH SENSITIVITY): Troponin I (High Sensitivity): 5 ng/L (ref <18)

## 2022-09-11 NOTE — Discharge Instructions (Addendum)
See your Physician for recheck in 2-3 days  

## 2022-09-11 NOTE — ED Provider Notes (Signed)
Clinical Course as of 09/11/22 0335  Thu Sep 11, 2022  0245 Patient reassessed. He is sleeping. Denies symptoms. States that he feels back to baseline. Delta troponin remains stable, negative. Plan for orthostatic vital signs. Anticipate discharge. [KH]  0330 Patient asymptomatic with orthostatics; vitals reassuring. OK for discharge and follow up with a PCP. Return precautions discussed and provided. Patient discharged in stable condition with no unaddressed concerns. [KH]    Clinical Course User Index [KH] Geanie Kenning, [place] 09/11/22 0335    Shon Baton, MD 09/11/22 [phone]

## 2022-09-11 NOTE — ED Notes (Signed)
Pt standing up at side of bed using urinal at this time

## 2022-09-11 NOTE — ED Notes (Signed)
ED provider at bedside.

## 2022-09-19 ENCOUNTER — Ambulatory Visit: Payer: PRIVATE HEALTH INSURANCE | Admitting: Cardiology

## 2022-09-19 ENCOUNTER — Encounter: Payer: Self-pay | Admitting: Cardiology

## 2022-09-19 ENCOUNTER — Telehealth: Payer: Self-pay

## 2022-09-19 VITALS — BP 152/92 | HR 85 | Resp 15 | Ht 66.0 in | Wt 172.6 lb

## 2022-09-19 DIAGNOSIS — R55 Syncope and collapse: Secondary | ICD-10-CM

## 2022-09-19 DIAGNOSIS — I10 Essential (primary) hypertension: Secondary | ICD-10-CM

## 2022-09-19 DIAGNOSIS — E1165 Type 2 diabetes mellitus with hyperglycemia: Secondary | ICD-10-CM

## 2022-09-19 DIAGNOSIS — F17210 Nicotine dependence, cigarettes, uncomplicated: Secondary | ICD-10-CM

## 2022-09-19 MED ORDER — HYDROCHLOROTHIAZIDE 25 MG PO TABS: 25.0000 mg | ORAL_TABLET | Freq: Every morning | ORAL | 0 refills | Status: DC

## 2022-09-19 MED ORDER — OLMESARTAN MEDOXOMIL 40 MG PO TABS: 40.0000 mg | ORAL_TABLET | Freq: Every day | ORAL | 0 refills | Status: DC

## 2022-09-19 NOTE — Telephone Encounter (Signed)
He cannot drive due to his recent syncope.  Otherwise, yes but if he has cardiac symptoms he goes to the closest ER for evaluation.   Dr.Tiombe Tomeo

## 2022-09-19 NOTE — Progress Notes (Unsigned)
ID:  Jacob Santos, DOB 1971/02/22, MRN [id number]  PCP:  Lin Landsman, MD  Cardiologist:  Rex Kras, DO, [facility] (established care 09/19/2022)  REASON FOR CONSULT: Syncope  REQUESTING PHYSICIAN:  Lin Landsman, [address] [place] [place],  [place] [address]  Chief Complaint  Patient presents with   Hypertension   New Patient (Initial Visit)    HPI  Jacob Santos is a 51 y.o. African-American male who presents to the clinic for evaluation of syncope /labile blood pressure at the request of Lin Landsman, MD. His past medical history and cardiovascular risk factors include: Non-insulin-dependent diabetes mellitus type 2, hypertension, smoking.   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  He was referred to his PCP for further evaluation and management.  He was initially placed on amlodipine 10 mg p.o. every morning.  Followed by olmesartan 40 mg p.o. every morning.  Since then has been having fluctuating blood pressures to the point that in December 2023 he was at the [place] he got up to attend to her daughter and felt hot, lightheaded, nauseated and felt like he would pass out and per his fiance (he had LOC for about 2 minutes).  As he regained consciousness paramedics were at bedside they gave him some high sugary drinks and thinking that he would be hypoglycemic but he ended up vomiting. He is a diabetic and he had not had any food for at least 24 hours and did not recall the last time he had water either. Patient was taken to the ED for further evaluation and management.  High sensitive troponins were negative x 2, chest x-ray noted no acute abnormalities, no renal insufficiency.  He stopped taking his antihypertensive medications since September 10, 2022 as directed by ED provider/PCP (per patient).  He stopped smoking 3 days ago cold [place] and has noted episodic headaches.  FUNCTIONAL STATUS: No  structured exercise program or daily routine.   ALLERGIES: Allergies  Allergen Reactions   Penicillins     Unknown reaction  Has patient had a PCN reaction causing immediate rash, facial/tongue/throat swelling, SOB or lightheadedness with hypotension: unknown Has patient had a PCN reaction causing severe rash involving mucus membranes or skin necrosis: unknown Has patient had a PCN reaction that required hospitalization: unknown Has patient had a PCN reaction occurring within the last 10 years: unknown If all of the above answers are "NO", then may proceed with Cephalosporin use.     MEDICATION LIST PRIOR TO VISIT: Current Meds  Medication Sig   acetaminophen (TYLENOL) 500 MG tablet Take 500 mg by mouth every 6 (six) hours as needed for moderate pain.   blood glucose meter kit and supplies Dispense based on patient and insurance preference. Use up to four times daily as directed. (FOR ICD-9 250.00, 250.01).   hydrochlorothiazide (HYDRODIURIL) 25 MG tablet Take 1 tablet (25 mg total) by mouth every morning.     PAST MEDICAL HISTORY: Past Medical History:  Diagnosis Date   Diabetes mellitus without complication ([place])    Hypertension     PAST SURGICAL HISTORY: Past Surgical History:  Procedure Laterality Date   SHOULDER SURGERY      FAMILY HISTORY: The patient family history includes Diabetes in his brother, brother, mother, and sister; Heart attack in his father; Heart failure in his brother; Hypertension in his brother, brother, and mother; Stroke in his mother.  SOCIAL HISTORY:  The patient  reports that he quit smoking 4 days ago. His smoking use included cigarettes. He has a 52.50 pack-year smoking history. He has never used smokeless tobacco. He reports current alcohol use. He reports that he does not use drugs.  REVIEW OF SYSTEMS: Review of Systems  Cardiovascular:  Positive for syncope (see HPI). Negative for chest pain, claudication, dyspnea on exertion, irregular  heartbeat, leg swelling, near-syncope, orthopnea, palpitations and paroxysmal nocturnal dyspnea.  Respiratory:  Negative for shortness of breath.   Hematologic/Lymphatic: Negative for bleeding problem.  Musculoskeletal:  Negative for muscle cramps and myalgias.  Neurological:  Negative for dizziness and light-headedness.    PHYSICAL EXAM:    09/19/2022   11:07 AM 09/11/2022    2:00 AM 09/11/2022   12:30 AM  Vitals with BMI  Height _0     Weight 172 lbs 10 oz    BMI 32.99    Systolic 242 683 419  Diastolic 92 80 76  Pulse 85 60 67   Orthostatic VS for the past 72 hrs (Last 3 readings):  Orthostatic BP Patient Position BP Location Cuff Size Orthostatic Pulse  09/19/22 1207 (!) 130/95 Standing Left Arm Normal 81  09/19/22 1206 (!) 141/100 Sitting Left Arm Normal 63  09/19/22 1205 134/85 Supine Left Arm Normal 56   Physical Exam  Constitutional: No distress.  Appears older than stated age, hemodynamically stable.   Neck: No JVD present.  Cardiovascular: Normal rate, regular rhythm, S1 normal, S2 normal, intact distal pulses and normal pulses. Exam reveals no gallop, no S3 and no S4.  No murmur heard. Pulmonary/Chest: Effort normal and breath sounds normal. No stridor. He has no wheezes. He has no rales.  Abdominal: Soft. Bowel sounds are normal. He exhibits no distension. There is no abdominal tenderness.  Musculoskeletal:        General: No edema.     Cervical back: Neck supple.  Neurological: He is alert and oriented to person, place, and time. He has intact cranial nerves (2-12).  Skin: Skin is warm and moist.   CARDIAC DATABASE: EKG: 09/19/2022: Sinus bradycardia, 57 bpm, without underlying ischemia or injury pattern  Echocardiogram: No results found for this or any previous visit from the past 1095 days.    Stress Testing: No results found for this or any previous visit from the past 1095 days.   Heart Catheterization: None  LABORATORY DATA:    Latest Ref  Rng & Units 09/10/2022    8:00 PM 04/02/2016    3:45 AM 03/31/2016   11:55 AM  CBC  WBC 4.0 - 10.5 K/uL 13.8  5.7  8.2   Hemoglobin 13.0 - 17.0 g/dL 11.8  14.2  16.6   Hematocrit 39.0 - 52.0 % 34.5  39.5  44.1   Platelets 150 - 400 K/uL 196  203  242        Latest Ref Rng & Units 09/10/2022    8:00 PM 04/02/2016    3:45 AM 04/01/2016    6:17 AM  CMP  Glucose 70 - 99 mg/dL 160  349  185   BUN 6 - 20 mg/dL _1 Creatinine 0.61 - 1.24 mg/dL 1.02  0.79  0.89   Sodium 135 - 145 mmol/L 142  133  136   Potassium 3.5 - 5.1 mmol/L 3.7  4.0  3.9   Chloride 98 - 111 mmol/L 111  106  107   CO2 22 - 32 mmol/L 22  21  22   Calcium 8.9 - 10.3 mg/dL 8.1  8.8  8.5   Total Protein 6.5 - 8.1 g/dL 5.7     Total Bilirubin 0.3 - 1.2 mg/dL 1.1     Alkaline Phos 38 - 126 U/L 54     AST 15 - 41 U/L 16     ALT 0 - 44 U/L 9       Lipid Panel  No results found for: "CHOL", "TRIG", "HDL", "CHOLHDL", "VLDL", "LDLCALC", "LDLDIRECT", "LABVLDL"  No components found for: "NTPROBNP" No results for input(s): "PROBNP" in the last 8760 hours. No results for input(s): "TSH" in the last 8760 hours.  BMP Recent Labs    09/10/22 2000  NA 142  K 3.7  CL 111  CO2 22  GLUCOSE 160*  BUN 13  CREATININE 1.02  CALCIUM 8.1*  GFRNONAA >60    HEMOGLOBIN A1C Lab Results  Component Value Date   HGBA1C 9.8 (H) 03/31/2016   MPG 235 03/31/2016    IMPRESSION:    ICD-10-CM   1. Syncope and collapse  R55 PCV ECHOCARDIOGRAM COMPLETE    PCV CARDIAC STRESS TEST    LONG TERM MONITOR-LIVE TELEMETRY (3-14 DAYS)    CT CARDIAC SCORING ([facility] LOCATIONS ONLY)    2. Benign hypertension  I10 EKG 12-Lead    olmesartan (BENICAR) 40 MG tablet    hydrochlorothiazide (HYDRODIURIL) 25 MG tablet    CMP14+EGFR    3. Type 2 diabetes mellitus with hyperglycemia, without long-term current use of insulin (HCC)  E11.65 CT CARDIAC SCORING ([facility] LOCATIONS ONLY)    Lipid Panel With LDL/HDL Ratio    LDL cholesterol, direct     4. Cigarette smoker  F17.210        RECOMMENDATIONS: Jacob Santos is a 51 y.o. African-American male whose past medical history and cardiac risk factors include: Non-insulin-dependent diabetes mellitus type 2, hypertension, smoking.   Syncope and collapse Likely secondary to vasovagal etiology given his prodromal symptoms, poor oral and hydration status. Orthostatic vital signs negative. 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 EKG: Nonischemic Echo will be ordered to evaluate for structural heart disease and left ventricular systolic function. Cardiac monitor to evaluate for dysrhythmias. Exercise treadmill stress test to evaluate functional capacity, exercise-induced arrhythmia/ischemia. Coronary calcium score given his diabetes and other risk factors.   Benign hypertension Referred to the practice for management of labile hypertension. Will discontinue amlodipine for now given his vasovagal syncope. Recommended to take olmesartan at night. Start hydrochlorothiazide in the morning. Labs in 1 week to evaluate kidney function.  Type 2 diabetes mellitus with hyperglycemia, without long-term current use of insulin ([place]) Educated on importance of glycemic control. Management per primary team. Currently on ARB. For reasons unknown he is currently not on statin therapy despite being diabetic; therefore, will check CAC  Cigarette smoker Tobacco cessation counseling: Patient is willing to quit at this time. 5 mins were spent counseling patient cessation techniques. We discussed various methods to help quit smoking, including deciding on a date to quit, joining a support group, pharmacological agents- nicotine gum/patch/lozenges.  I will reassess his progress at the next follow-up visit  FINAL MEDICATION LIST END OF ENCOUNTER: Meds ordered this encounter  Medications   olmesartan (BENICAR)  40 MG tablet    Sig: Take 1 tablet (40 mg total) by mouth daily at 10 pm.    Dispense:  30 tablet    Refill:  0   hydrochlorothiazide (HYDRODIURIL) 25 MG tablet    Sig: Take 1 tablet (25 mg total) by mouth every morning.    Dispense:  90 tablet    Refill:  0    Medications Discontinued During This Encounter  Medication Reason   amLODipine (NORVASC) 10 MG tablet    insulin aspart (NOVOLOG) 100 UNIT/ML injection Patient Preference   insulin aspart protamine- aspart (NOVOLOG MIX 70/30) (70-30) 100 UNIT/ML injection Patient Preference   insulin regular (NOVOLIN R,HUMULIN R) 100 units/mL injection Patient Preference   Insulin Syringe-Needle U-100 (INSULIN SYRINGE 1CC/31GX5/16") 31G X 5/16" 1 ML MISC Patient Preference   nicotine (NICODERM CQ - DOSED IN MG/24 HOURS) 14 mg/24hr patch Patient Preference   olmesartan (BENICAR) 40 MG tablet Reorder     Current Outpatient Medications:    acetaminophen (TYLENOL) 500 MG tablet, Take 500 mg by mouth every 6 (six) hours as needed for moderate pain., Disp: , Rfl:    blood glucose meter kit and supplies, Dispense based on patient and insurance preference. Use up to four times daily as directed. (FOR ICD-9 250.00, 250.01)., Disp: 1 each, Rfl: 0   hydrochlorothiazide (HYDRODIURIL) 25 MG tablet, Take 1 tablet (25 mg total) by mouth every morning., Disp: 90 tablet, Rfl: 0   olmesartan (BENICAR) 40 MG tablet, Take 1 tablet (40 mg total) by mouth daily at 10 pm., Disp: 30 tablet, Rfl: 0  Orders Placed This Encounter  Procedures   CT CARDIAC SCORING ([facility] LOCATIONS ONLY)   Lipid Panel With LDL/HDL Ratio   LDL cholesterol, direct   CMP14+EGFR   PCV CARDIAC STRESS TEST   LONG TERM MONITOR-LIVE TELEMETRY (3-14 DAYS)   EKG 12-Lead   PCV ECHOCARDIOGRAM COMPLETE    There are no Patient Instructions on file for this visit.   --Continue cardiac medications as reconciled in final medication list. --Return in about 6 weeks (around 10/31/2022) for Follow up,  syncope, Review test results. or sooner if needed. --Continue follow-up with your primary care physician regarding the management of your other chronic comorbid conditions.  Patient's questions and concerns were addressed to his satisfaction. He voices understanding of the instructions provided during this encounter.   This note was created using a voice recognition software as a result there may be grammatical errors inadvertently enclosed that do not reflect the nature of this encounter. Every attempt is made to correct such errors.  Rex Kras, [place], [facility]  Pager: [phone] Office: [phone]

## 2022-09-19 NOTE — Telephone Encounter (Signed)
Patient called and asked if he can travel out of state?

## 2022-09-20 ENCOUNTER — Encounter: Payer: Self-pay | Admitting: Cardiology

## 2022-09-23 ENCOUNTER — Telehealth: Payer: Self-pay

## 2022-09-23 NOTE — Telephone Encounter (Signed)
Patient calling because he needs a work note, saying he will be out for 6 month for FMLA.  Call back number: [phone]

## 2022-09-23 NOTE — Telephone Encounter (Signed)
Please draft a note for work.  Since he passed out he will need to be evaluated by neurology as well. PCP can refer him.   Dr. Odis Hollingshead

## 2022-09-24 ENCOUNTER — Other Ambulatory Visit: Payer: PRIVATE HEALTH INSURANCE

## 2022-10-10 ENCOUNTER — Ambulatory Visit: Payer: PRIVATE HEALTH INSURANCE

## 2022-10-10 ENCOUNTER — Other Ambulatory Visit: Payer: Self-pay

## 2022-10-10 DIAGNOSIS — R55 Syncope and collapse: Secondary | ICD-10-CM

## 2022-10-10 DIAGNOSIS — E1165 Type 2 diabetes mellitus with hyperglycemia: Secondary | ICD-10-CM

## 2022-10-10 DIAGNOSIS — I10 Essential (primary) hypertension: Secondary | ICD-10-CM

## 2022-10-14 NOTE — Progress Notes (Signed)
Called patient, phone not accepting calls at this time.

## 2022-10-14 NOTE — Progress Notes (Signed)
Called patient, phone not accepting calls at this time.

## 2022-10-28 ENCOUNTER — Ambulatory Visit: Payer: PRIVATE HEALTH INSURANCE | Admitting: Cardiology

## 2022-10-29 ENCOUNTER — Ambulatory Visit
Admission: RE | Admit: 2022-10-29 | Discharge: 2022-10-29 | Disposition: A | Discharge status: Home / Self Care | Payer: No Typology Code available for payment source | Source: Ambulatory Visit | Attending: Cardiology | Admitting: Cardiology

## 2022-10-29 DIAGNOSIS — E1165 Type 2 diabetes mellitus with hyperglycemia: Secondary | ICD-10-CM

## 2022-10-29 DIAGNOSIS — R55 Syncope and collapse: Secondary | ICD-10-CM

## 2022-11-13 ENCOUNTER — Encounter: Payer: Self-pay | Admitting: Cardiology

## 2022-11-13 ENCOUNTER — Ambulatory Visit: Payer: PRIVATE HEALTH INSURANCE | Admitting: Cardiology

## 2022-11-13 VITALS — BP 170/89 | HR 87 | Resp 16 | Ht 66.0 in | Wt 182.6 lb

## 2022-11-13 DIAGNOSIS — I455 Other specified heart block: Secondary | ICD-10-CM

## 2022-11-13 DIAGNOSIS — E1165 Type 2 diabetes mellitus with hyperglycemia: Secondary | ICD-10-CM

## 2022-11-13 DIAGNOSIS — E1169 Type 2 diabetes mellitus with other specified complication: Secondary | ICD-10-CM

## 2022-11-13 DIAGNOSIS — F17210 Nicotine dependence, cigarettes, uncomplicated: Secondary | ICD-10-CM

## 2022-11-13 DIAGNOSIS — R931 Abnormal findings on diagnostic imaging of heart and coronary circulation: Secondary | ICD-10-CM

## 2022-11-13 DIAGNOSIS — R55 Syncope and collapse: Secondary | ICD-10-CM

## 2022-11-13 DIAGNOSIS — I10 Essential (primary) hypertension: Secondary | ICD-10-CM

## 2022-11-13 MED ORDER — ASPIRIN 81 MG PO TBEC: 81.0000 mg | DELAYED_RELEASE_TABLET | Freq: Every day | ORAL | 12 refills | Status: AC

## 2022-11-13 MED ORDER — OLMESARTAN MEDOXOMIL 40 MG PO TABS: 40.0000 mg | ORAL_TABLET | Freq: Every day | ORAL | 0 refills | Status: DC

## 2022-11-13 MED ORDER — HYDROCHLOROTHIAZIDE 25 MG PO TABS: 25.0000 mg | ORAL_TABLET | Freq: Every morning | ORAL | 0 refills | Status: AC

## 2022-11-13 MED ORDER — ATORVASTATIN CALCIUM 20 MG PO TABS: 20.0000 mg | ORAL_TABLET | Freq: Every day | ORAL | 0 refills | Status: AC

## 2022-11-13 NOTE — Progress Notes (Signed)
ID:  Jacob Santos, DOB April 20, 1971, MRN [id number]  PCP:  Lin Landsman, MD  Cardiologist:  Rex Kras, DO, [facility] (established care 09/19/2022)  Date: 11/13/22 Last Office Visit: 09/19/2022  Chief Complaint  Patient presents with   Follow-up    Discuss current workup of syncope    HPI  Jacob Santos is a 52 y.o. African-American male whose past medical history and cardiovascular risk factors include: Moderate coronary calcification (total CAC 165, 96 percentile), non-insulin-dependent diabetes mellitus type 2, hypertension, smoking.   Patient was referred to the practice after having a syncopal event.  Based on his symptoms it appears that is likely vasovagal along with poor hydration and oral intake.  At the last office visit the shared decision was to proceed with an echocardiogram, exercise treadmill stress test, Zio patch and coronary calcium score.  He has not had any syncopal events since last office visit.  He usually goes to sleep at night at 10 PM.  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  At the last office visit I educated him on the importance of blood pressure management.  However he has not been taking his medications because he claims that they are expensive.  On the contrary, he smokes regularly.   FUNCTIONAL STATUS: No structured exercise program or daily routine.   ALLERGIES: Allergies  Allergen Reactions   Penicillins     Unknown reaction  Has patient had a PCN reaction causing immediate rash, facial/tongue/throat swelling, SOB or lightheadedness with hypotension: unknown Has patient had a PCN reaction causing severe rash involving mucus membranes or skin necrosis: unknown Has patient had a PCN reaction that required hospitalization: unknown Has patient had a PCN reaction occurring within the last 10 years: unknown If all of the above answers are "NO", then may proceed with Cephalosporin  use.     MEDICATION LIST PRIOR TO VISIT: Current Meds  Medication Sig   aspirin EC 81 MG tablet Take 1 tablet (81 mg total) by mouth daily. Swallow whole.   atorvastatin (LIPITOR) 20 MG tablet Take 1 tablet (20 mg total) by mouth at bedtime.   blood glucose meter kit and supplies Dispense based on patient and insurance preference. Use up to four times daily as directed. (FOR ICD-9 250.00, 250.01).     PAST MEDICAL HISTORY: Past Medical History:  Diagnosis Date   Coronary artery calcification    Diabetes mellitus without complication ([place])    Hypertension     PAST SURGICAL HISTORY: Past Surgical History:  Procedure Laterality Date   SHOULDER SURGERY      FAMILY HISTORY: The patient family history includes Diabetes in his brother, brother, mother, and sister; Heart attack in his father; Heart failure in his brother; Hypertension in his brother, brother, and mother; Stroke in his mother.  SOCIAL HISTORY:  The patient  reports that he quit smoking about 2 months ago. His smoking use included cigarettes. He has a 52.50 pack-year smoking history. He has never used smokeless tobacco. He reports current alcohol use. He reports that he does not use drugs.  REVIEW OF SYSTEMS: Review of Systems  Cardiovascular:  Positive for syncope (Last episode 09/10/2022, per patient). Negative for chest pain, claudication, dyspnea on exertion, irregular heartbeat, leg swelling, near-syncope, orthopnea, palpitations and paroxysmal nocturnal dyspnea.  Respiratory:  Negative for shortness of breath.   Hematologic/Lymphatic: Negative for bleeding problem.  Musculoskeletal:  Negative for muscle cramps and  myalgias.  Neurological:  Negative for dizziness and light-headedness.    PHYSICAL EXAM:    11/13/2022    3:08 PM 09/19/2022   11:07 AM 09/11/2022    2:00 AM  Vitals with BMI  Height 5' 6"$  5' 6"$    Weight 182 lbs 10 oz 172 lbs 10 oz   BMI 99991111 0000000   Systolic [id number] 0000000 [id number]  Diastolic 89 92 80   Pulse 87 85 60    Physical Exam  Constitutional: No distress.  Appears older than stated age, hemodynamically stable.   Neck: No JVD present.  Cardiovascular: Normal rate, regular rhythm, S1 normal, S2 normal, intact distal pulses and normal pulses. Exam reveals no gallop, no S3 and no S4.  No murmur heard. Pulmonary/Chest: Effort normal and breath sounds normal. No stridor. He has no wheezes. He has no rales.  Abdominal: Soft. Bowel sounds are normal. He exhibits no distension. There is no abdominal tenderness.  Musculoskeletal:        General: No edema.     Cervical back: Neck supple.  Neurological: He is alert and oriented to person, place, and time. He has intact cranial nerves (2-12).  Skin: Skin is warm and moist.   CARDIAC DATABASE: EKG: 09/19/2022: Sinus bradycardia, 57 bpm, without underlying ischemia or injury pattern  Echocardiogram: 10/10/2022: Normal LV systolic function with visual EF 60-65%. Left ventricle cavity is normal in size. Normal left ventricular wall thickness. Normal global wall motion. Normal diastolic filling pattern, normal LAP. Mild pulmonic regurgitation. No prior study for comparison.     Stress Testing: Exercise treadmill stress test 10/10/2022: Exercise treadmill stress test performed using Bruce protocol. Patient reached 11.3 METS, and 89% of age predicted maximum heart rate. Exercise capacity was excellent. No chest pain reported. Normal heart rate and hemodynamic response. Stress EKG revealed no ischemic changes. Low risk study.    Heart Catheterization: None  CT Cardiac Scoring: Left Main: 7 LAD: 151 LCx: 3 RCA: 4 Total Agatston Score: 165 MESA database percentile: 96th  Noncardiac findings: NA   Cardiac monitor (Zio Patch): October 10, 2022 - October 24, 2022 Dominant rhythm sinus.  Heart rate 40-135 bpm.  Avg HR 77 bpm. No atrial fibrillation, supraventricular tachycardia, ventricular tachycardia, high grade AV block. Total  ventricular ectopic burden <1%. Total supraventricular ectopic burden <1%. 2 pauses greater than 3 seconds (asymptomatic events).  Longest pause 10/23/2022 at 10:50 PM, 4.1 seconds. Second longest pause 10/19/2022 at 10:18 AM 3.5 seconds. Patient triggered events: 11.  Predominantly sinus or sinus tachycardia without any significant ectopy.  LABORATORY DATA:    Latest Ref Rng & Units 09/10/2022    8:00 PM 04/02/2016    3:45 AM 03/31/2016   11:55 AM  CBC  WBC 4.0 - 10.5 K/uL 13.8  5.7  8.2   Hemoglobin 13.0 - 17.0 g/dL 11.8  14.2  16.6   Hematocrit 39.0 - 52.0 % 34.5  39.5  44.1   Platelets 150 - 400 K/uL 196  203  242        Latest Ref Rng & Units 09/10/2022    8:00 PM 04/02/2016    3:45 AM 04/01/2016    6:17 AM  CMP  Glucose 70 - 99 mg/dL 160  349  185   BUN 6 - 20 mg/dL 13  18  15   $ Creatinine 0.61 - 1.24 mg/dL 1.02  0.79  0.89   Sodium 135 - 145 mmol/L 142  133  136   Potassium 3.5 - 5.1 mmol/L  3.7  4.0  3.9   Chloride 98 - 111 mmol/L 111  106  107   CO2 22 - 32 mmol/L 22  21  22   $ Calcium 8.9 - 10.3 mg/dL 8.1  8.8  8.5   Total Protein 6.5 - 8.1 g/dL 5.7     Total Bilirubin 0.3 - 1.2 mg/dL 1.1     Alkaline Phos 38 - 126 U/L 54     AST 15 - 41 U/L 16     ALT 0 - 44 U/L 9       Lipid Panel  No results found for: "CHOL", "TRIG", "HDL", "CHOLHDL", "VLDL", "LDLCALC", "LDLDIRECT", "LABVLDL"  No components found for: "NTPROBNP" No results for input(s): "PROBNP" in the last 8760 hours. No results for input(s): "TSH" in the last 8760 hours.  BMP Recent Labs    09/10/22 2000  NA 142  K 3.7  CL 111  CO2 22  GLUCOSE 160*  BUN 13  CREATININE 1.02  CALCIUM 8.1*  GFRNONAA >60    HEMOGLOBIN A1C Lab Results  Component Value Date   HGBA1C 9.8 (H) 03/31/2016   MPG 235 03/31/2016    IMPRESSION:    ICD-10-CM   1. Vasovagal syncope  R55 Ambulatory referral to Cardiac Electrophysiology    2. Sinus pause  I45.5     3. Type 2 diabetes mellitus with hyperlipidemia (HCC)   E11.69 atorvastatin (LIPITOR) 20 MG tablet   E78.5     4. Agatston coronary artery calcium score between 100 and 400  R93.1 aspirin EC 81 MG tablet    atorvastatin (LIPITOR) 20 MG tablet    Hepatic function panel    Lipid Panel With LDL/HDL Ratio    LDL cholesterol, direct    5. Benign hypertension  I10 hydrochlorothiazide (HYDRODIURIL) 25 MG tablet    olmesartan (BENICAR) 40 MG tablet    Ambulatory referral to Sleep Studies    Basic metabolic panel    Basic metabolic panel    6. Type 2 diabetes mellitus with hyperglycemia, without long-term current use of insulin (HCC)  E11.65 atorvastatin (LIPITOR) 20 MG tablet    Hepatic function panel    Lipid Panel With LDL/HDL Ratio    LDL cholesterol, direct    7. Cigarette smoker  F17.210        RECOMMENDATIONS: Jacob Santos is a 52 y.o. African-American male whose past medical history and cardiac risk factors include: Non-insulin-dependent diabetes mellitus type 2, hypertension, smoking.   Vasovagal syncope Sinus pause Index event 09/10/2022. Based on symptoms likely vasovagal etiology. As part of syncope workup patient underwent an echocardiogram, GXT, coronary calcium score, and Zio patch results discussed with him in detail and noted above for further reference. Zio patch noted 2 episodes of pauses greater than 3 seconds.  One was while he was asleep but the other occurred during the daytime as detailed above.  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 Given nocturnal pauses I will also refer him to sleep medicine for sleep apnea evaluation. No recurrence since the index event. He is aware of [place] driving laws to stop driving after an episode of loss of consciousness until 6 months event-free.  Type 2 diabetes mellitus with hyperlipidemia ([place]) Reemphasized importance of glycemic control and lipid management. Will  defer glycemic management to PCP. In the past despite being a diabetic patient was not  placed on statin therapy.  However, given his CAC will start atorvastatin 20 mg p.o. nightly.  Labs in 6 weeks to reevaluate lipid therapy  Agatston coronary artery calcium score between 100 and 400 Total CAC 165, 96th percentile. Denies anginal discomfort. Echo January 2024: Preserved LVEF without any significant valvular heart disease. GXT: Low risk stress test. Educated on importance of secondary prevention.  Benign hypertension All above measures are not well-controlled. Due to his decision not to be on antihypertensive medications as he states it is cost prohibitive.  Medication that he is currently on are readily available and usually subsidized at various grocery stores at low cost.  On the contrary, he continues to smoke which is an expensive habit and reemphasized the importance of prioritizing health versus leisure. Prescriptions for hydrochlorothiazide, olmesartan provided. With labs in 1 week to evaluate kidney function and electrolytes  Type 2 diabetes mellitus with hyperglycemia, without long-term current use of insulin ([place]) Reemphasized importance of glycemic control. Currently managed by primary care provider.  Cigarette smoker Tobacco cessation counseling: Currently smoking 1 packs/day   Patient was informed of the dangers of tobacco abuse including stroke, cancer, and MI, as well as benefits of tobacco cessation. Patient is not willing to quit at this time. 7 mins were spent counseling patient cessation techniques. We discussed various methods to help quit smoking, including deciding on a date to quit, joining a support group, pharmacological agents- nicotine gum/patch/lozenges.  I will reassess his progress at the next follow-up visit  FINAL MEDICATION LIST END OF ENCOUNTER: Meds ordered this encounter  Medications   hydrochlorothiazide (HYDRODIURIL) 25 MG tablet    Sig: Take 1  tablet (25 mg total) by mouth every morning.    Dispense:  90 tablet    Refill:  0   olmesartan (BENICAR) 40 MG tablet    Sig: Take 1 tablet (40 mg total) by mouth daily at 10 pm.    Dispense:  30 tablet    Refill:  0   aspirin EC 81 MG tablet    Sig: Take 1 tablet (81 mg total) by mouth daily. Swallow whole.    Dispense:  30 tablet    Refill:  12   atorvastatin (LIPITOR) 20 MG tablet    Sig: Take 1 tablet (20 mg total) by mouth at bedtime.    Dispense:  90 tablet    Refill:  0    Medications Discontinued During This Encounter  Medication Reason   acetaminophen (TYLENOL) 500 MG tablet    hydrochlorothiazide (HYDRODIURIL) 25 MG tablet    olmesartan (BENICAR) 40 MG tablet Cost of medication     Current Outpatient Medications:    aspirin EC 81 MG tablet, Take 1 tablet (81 mg total) by mouth daily. Swallow whole., Disp: 30 tablet, Rfl: 12   atorvastatin (LIPITOR) 20 MG tablet, Take 1 tablet (20 mg total) by mouth at bedtime., Disp: 90 tablet, Rfl: 0   blood glucose meter kit and supplies, Dispense based on patient and insurance preference. Use up to four times daily as directed. (FOR ICD-9 250.00, 250.01)., Disp: 1 each, Rfl: 0   hydrochlorothiazide (HYDRODIURIL) 25 MG tablet, Take 1 tablet (25 mg total) by mouth every morning., Disp: 90 tablet, Rfl: 0   olmesartan (BENICAR) 40 MG tablet, Take 1 tablet (40 mg total) by mouth daily at 10 pm., Disp: 30 tablet, Rfl: 0  Orders Placed This Encounter  Procedures   Hepatic function panel   Lipid Panel With LDL/HDL Ratio  LDL cholesterol, direct   Basic metabolic panel   Ambulatory referral to Sleep Studies   Ambulatory referral to Cardiac Electrophysiology    There are no Patient Instructions on file for this visit.   --Continue cardiac medications as reconciled in final medication list. --Return in about 7 weeks (around 01/01/2023) for Follow up , Coronary artery calcification, review labs, status post EP evaluation. or sooner if  needed. --Continue follow-up with your primary care physician regarding the management of your other chronic comorbid conditions.  Patient's questions and concerns were addressed to his satisfaction. He voices understanding of the instructions provided during this encounter.   This note was created using a voice recognition software as a result there may be grammatical errors inadvertently enclosed that do not reflect the nature of this encounter. Every attempt is made to correct such errors.  Rex Kras, [place], [facility]  Pager: [phone] Office: [phone]

## 2022-11-17 ENCOUNTER — Encounter: Payer: Self-pay | Admitting: Cardiology

## 2022-12-01 NOTE — Progress Notes (Unsigned)
  Electrophysiology Office Note:    Date:  12/01/2022   ID:  Jacob Santos, DOB [birth year], MRN [id number]  [facility] Cardiologist:  None  [facility] Electrophysiologist:  Vickie Epley, MD   Referring MD: Rex Kras, DO   Chief Complaint: Syncope  History of Present Illness:    Jacob Santos is a 52 y.o. male who I am seeing today for an evaluation of syncope at the request of Dr. Ed Blalock.  The patient was last seen by Dr. Ed Blalock November 13, 2022.  The patient has a history of coronary artery disease, non-insulin-dependent diabetes, hypertension and tobacco abuse.          Their past medical, social and family history was reveiwed.   ROS:   Please see the history of present illness.    All other systems reviewed and are negative.  EKGs/Labs/Other Studies Reviewed:    The following studies were reviewed today:  October 10, 2022 - October 24, 2022 ZIO monitor personally reviewed Dominant rhythm sinus.  Heart rate 40-135 bpm.  Avg HR 77 bpm. No atrial fibrillation, supraventricular tachycardia, ventricular tachycardia, high grade AV block. Total ventricular ectopic burden <1%. Total supraventricular ectopic burden <1%. 2 pauses greater than 3 seconds (asymptomatic events).  Longest pause 10/23/2022 at 10:50 PM, 4.1 seconds. Second longest pause 10/19/2022 at 10:18 AM 3.5 seconds. Patient triggered events: 11.  Predominantly sinus or sinus tachycardia without any significant ectopy.     Physical Exam:    VS:  There were no vitals taken for this visit.    Wt Readings from Last 3 Encounters:  11/13/22 182 lb 9.6 oz (82.8 kg)  09/19/22 172 lb 9.6 oz (78.3 kg)  09/10/22 186 lb (84.4 kg)     GEN: *** Well nourished, well developed in no acute distress CARDIAC: ***RRR, no murmurs, rubs, gallops RESPIRATORY:  Clear to auscultation without rales, wheezing or rhonchi       ASSESSMENT AND PLAN:    No diagnosis found.  #Vasovagal syncope Discussed the  pathophysiology of vasovagal syncope during today's clinic appointment.  We discussed other mechanisms of syncope including orthostatic hypotension, arrhythmic syncope.  Based on his history, I think the most likely explanation is vasovagal syncope.  I have encouraged him to stay hydrated.  He should take all transitions in position slowly.  If he were to experience prodromal symptoms, he should take the seriously and get to the ground immediately.  Given his monitor showed sinus pauses, I do think it is reasonable to employ an ongoing heart rhythm surveillance strategy.  We discussed options including wearable's and loop recorder monitoring.***  #Sinus pauses Asymptomatic.  Do not appear to be associated with syncope.  Continue monitoring for now.        Signed, Hilton Cork. Quentin Ore, MD, [facility], [facility] 12/01/2022 9:30 PM    Electrophysiology [facility]

## 2022-12-02 ENCOUNTER — Ambulatory Visit: Payer: No Typology Code available for payment source | Attending: Cardiology | Admitting: Cardiology

## 2022-12-02 ENCOUNTER — Encounter: Payer: Self-pay | Admitting: Cardiology

## 2022-12-02 VITALS — BP 156/102 | HR 73 | Ht 66.0 in | Wt 182.0 lb

## 2022-12-02 DIAGNOSIS — R55 Syncope and collapse: Secondary | ICD-10-CM

## 2022-12-02 DIAGNOSIS — I10 Essential (primary) hypertension: Secondary | ICD-10-CM

## 2022-12-02 DIAGNOSIS — I455 Other specified heart block: Secondary | ICD-10-CM

## 2022-12-02 NOTE — Patient Instructions (Signed)
Medication Instructions:  Your physician recommends that you continue on your current medications as directed. Please refer to the Current Medication list given to you today.  *If you need a refill on your cardiac medications before your next appointment, please call your pharmacy*  Follow-Up: At [facility], you and your health needs are our priority.  As part of our continuing mission to provide you with exceptional heart care, we have created designated Provider Care Teams.  These Care Teams include your primary Cardiologist (physician) and Advanced Practice Providers (APPs -  Physician Assistants and Nurse Practitioners) who all work together to provide you with the care you need, when you need it.  Your next appointment:   As needed with Dr. Quentin Ore

## 2022-12-02 NOTE — Progress Notes (Signed)
Electrophysiology Office Note:    Date:  12/02/2022   ID:  Jacob Santos, DOB Mar 14, 1971, MRN [id number]  [facility] Cardiologist:  None  [facility] Electrophysiologist:  Vickie Epley, MD   Referring MD: Rex Kras, DO   Chief Complaint: Syncope  History of Present Illness:    Jacob Santos is a 52 y.o. male who I am seeing today for an evaluation of syncope at the request of Dr. Ed Blalock.  The patient was last seen by Dr. Ed Blalock November 13, 2022.  The patient has a history of coronary artery disease, non-insulin-dependent diabetes, hypertension and tobacco abuse.  Today, he confirms having a recent syncopal episode on 09/10/2022. That morning he took his meds and worked all day. He later went out to eat for dinner. Prior to eating after walking around and talking with others, he sat down and felt dizzy. He was also feeling hot and nauseous. This worsened after a moment when he tried to stand up again. His fiancee told him that he essentially slumped over into her arms.  He was unconscious for 3 minutes. When he came to, he initially felt fine, but suddenly started vomiting. At that time EMS had already arrived. He felt okay as he was taken to the ambulance, where his blood pressure was noted to be 90/40. He was then evaluated at the ED. Never had a similar episode in his life, this was the first. There were no recent viral infections or other illnesses.  In clinic today his blood pressure is elevated at 156/102. At night he takes benicar and lipitor. In the mornings he takes HCTZ and 81 mg ASA.  He is not sure why amlodipine was discontinued. He was already on Benicar and amlodipine prior to his syncopal episode.  He denies any palpitations, chest pain, shortness of breath, or peripheral edema. No headaches, orthopnea, or PND.  He is scheduled for a follow-up visit tomorrow with Dr. Terri Skains.     Their past medical, social and family history was reveiwed.   ROS:   Please see the  history of present illness.    (+) Syncope with associated dizziness, nausea, and vomiting. All other systems reviewed and are negative.  EKGs/Labs/Other Studies Reviewed:    The following studies were reviewed today:  October 10, 2022 - October 24, 2022 ZIO monitor personally reviewed Dominant rhythm sinus.  Heart rate 40-135 bpm.  Avg HR 77 bpm. No atrial fibrillation, supraventricular tachycardia, ventricular tachycardia, high grade AV block. Total ventricular ectopic burden <1%. Total supraventricular ectopic burden <1%. 2 pauses greater than 3 seconds (asymptomatic events).  Longest pause 10/23/2022 at 10:50 PM, 4.1 seconds. Second longest pause 10/19/2022 at 10:18 AM 3.5 seconds. Patient triggered events: 11.  Predominantly sinus or sinus tachycardia without any significant ectopy.   Physical Exam:    VS:  BP (!) 156/102   Pulse 73   Ht '5\' 6"'$  (1.676 m)   Wt 182 lb (82.6 kg)   SpO2 97%   BMI 29.38 kg/m     Wt Readings from Last 3 Encounters:  12/02/22 182 lb (82.6 kg)  11/13/22 182 lb 9.6 oz (82.8 kg)  09/19/22 172 lb 9.6 oz (78.3 kg)     GEN:  Well nourished, well developed in no acute distress CARDIAC: RRR, no murmurs, rubs, gallops RESPIRATORY:  Clear to auscultation without rales, wheezing or rhonchi       ASSESSMENT AND PLAN:    1. Vasovagal syncope   2. Sinus pause  3. Primary hypertension     #Vasovagal syncope Discussed the pathophysiology of vasovagal syncope during today's clinic appointment.  We discussed other mechanisms of syncope including orthostatic hypotension, arrhythmic syncope.  Based on his history, I think the most likely explanation is vasovagal syncope likely exacerbated by dehydration from his work today.  I have encouraged him to stay hydrated. If he were to experience prodromal symptoms, he should take the seriously and get to the ground immediately.  I do not have a suspicion for arrhythmic syncope given his history.  The patient had  several minutes of prodromal symptoms that were worsened when he decided to stand up at the restaurant.  Given the lack of abrupt symptoms, I think it would be acceptable for him to drive.  #Sinus pauses Asymptomatic.  Not associated with syncope or presyncope.  Continue monitoring for now.  Follow-up with EP on an as-needed basis. Follow-up with Dr. Terri Skains as scheduled tomorrow.  I,Mathew Stumpf,acting as a Education administrator for Vickie Epley, MD.,have documented all relevant documentation on the behalf of Vickie Epley, MD,as directed by  Vickie Epley, MD while in the presence of Vickie Epley, MD.  I, Vickie Epley, MD, have reviewed all documentation for this visit. The documentation on 12/02/22 for the exam, diagnosis, procedures, and orders are all accurate and complete.   Signed, Hilton Cork. Quentin Ore, MD, [facility], [facility] 12/02/2022 10:08 AM    Electrophysiology [facility]

## 2022-12-17 ENCOUNTER — Other Ambulatory Visit: Payer: Self-pay

## 2022-12-17 DIAGNOSIS — I10 Essential (primary) hypertension: Secondary | ICD-10-CM

## 2022-12-17 MED ORDER — OLMESARTAN MEDOXOMIL 40 MG PO TABS: 40.0000 mg | ORAL_TABLET | Freq: Every day | ORAL | 11 refills | Status: AC

## 2022-12-22 ENCOUNTER — Ambulatory Visit: Payer: PRIVATE HEALTH INSURANCE | Admitting: Cardiology

## 2022-12-22 ENCOUNTER — Encounter: Payer: Self-pay | Admitting: Cardiology

## 2022-12-22 VITALS — BP 153/90 | HR 94 | Resp 16 | Ht 66.0 in | Wt 179.2 lb

## 2022-12-22 DIAGNOSIS — R931 Abnormal findings on diagnostic imaging of heart and coronary circulation: Secondary | ICD-10-CM

## 2022-12-22 DIAGNOSIS — F17210 Nicotine dependence, cigarettes, uncomplicated: Secondary | ICD-10-CM

## 2022-12-22 DIAGNOSIS — E1165 Type 2 diabetes mellitus with hyperglycemia: Secondary | ICD-10-CM

## 2022-12-22 DIAGNOSIS — I10 Essential (primary) hypertension: Secondary | ICD-10-CM

## 2022-12-22 DIAGNOSIS — R55 Syncope and collapse: Secondary | ICD-10-CM

## 2022-12-22 DIAGNOSIS — I455 Other specified heart block: Secondary | ICD-10-CM

## 2022-12-22 DIAGNOSIS — E1169 Type 2 diabetes mellitus with other specified complication: Secondary | ICD-10-CM

## 2022-12-22 MED ORDER — AMLODIPINE BESYLATE 5 MG PO TABS: 5.0000 mg | ORAL_TABLET | Freq: Every morning | ORAL | 0 refills | Status: AC

## 2022-12-22 NOTE — Progress Notes (Signed)
ID:  Jacob Santos, DOB 10-20-1970, MRN [id number]  PCP:  Lin Landsman, MD  Cardiologist:  Rex Kras, DO, [facility] (established care 09/19/2022)  Date: 12/22/22 Last Office Visit: 11/13/2022  Chief Complaint  Patient presents with   Loss of Consciousness   Follow-up    7 weeks    HPI  Jacob Santos is a 52 y.o. African-American male whose past medical history and cardiovascular risk factors include: Moderate coronary calcification (total CAC 165, 96 percentile), non-insulin-dependent diabetes mellitus type 2, hypertension, smoking.   Patient establish care given his recent episode of syncope which was likely due to vasovagal etiology-poor hydration and oral intake.  As part of syncope workup he did undergo an echo, exercise treadmill stress test, Zio patch, and coronary calcium score.  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  He establish care and saw Dr. Quentin Ore recommends no additional workup warranted at this time.  Since last office visit he denies any near-syncope or syncopal events.  His home blood pressures are not well-controlled on current medical therapy and is requesting assistance until he has a chance to follow-up.  PCP in April 2024.  FUNCTIONAL STATUS: No structured exercise program or daily routine.   ALLERGIES: Allergies  Allergen Reactions   Penicillins     Unknown reaction  Has patient had a PCN reaction causing immediate rash, facial/tongue/throat swelling, SOB or lightheadedness with hypotension: unknown Has patient had a PCN reaction causing severe rash involving mucus membranes or skin necrosis: unknown Has patient had a PCN reaction that required hospitalization: unknown Has patient had a PCN reaction occurring within the last 10 years: unknown If all of the above answers are  "NO", then may proceed with Cephalosporin use.     MEDICATION LIST PRIOR TO VISIT: Current Meds  Medication Sig   amLODipine (NORVASC) 5 MG tablet Take 1 tablet (5 mg total) by mouth every morning.   aspirin EC 81 MG tablet Take 1 tablet (81 mg total) by mouth daily. Swallow whole.   atorvastatin (LIPITOR) 20 MG tablet Take 1 tablet (20 mg total) by mouth at bedtime.   blood glucose meter kit and supplies Dispense based on patient and insurance preference. Use up to four times daily as directed. (FOR ICD-9 250.00, 250.01).   hydrochlorothiazide (HYDRODIURIL) 25 MG tablet Take 1 tablet (25 mg total) by mouth every morning.   olmesartan (BENICAR) 40 MG tablet Take 1 tablet (40 mg total) by mouth daily at 10 pm.     PAST MEDICAL HISTORY: Past Medical History:  Diagnosis Date   Coronary artery calcification    Diabetes mellitus without complication ([place])    Hypertension     PAST SURGICAL HISTORY: Past Surgical History:  Procedure Laterality Date   SHOULDER SURGERY      FAMILY HISTORY: The patient family history includes Diabetes in his brother, brother, mother, and sister; Heart attack in his father; Heart failure in his brother; Hypertension in his brother, brother, and mother; Stroke in his mother.  SOCIAL HISTORY:  The patient  reports that he quit smoking about 3 months ago. His smoking use included cigarettes. He has a 52.50 pack-year smoking history. He has never used smokeless tobacco. He reports current alcohol use. He reports that he does not use drugs.  REVIEW OF SYSTEMS: Review  of Systems  Cardiovascular:  Positive for syncope (Last episode 09/10/2022, per patient). Negative for chest pain, claudication, dyspnea on exertion, irregular heartbeat, leg swelling, near-syncope, orthopnea, palpitations and paroxysmal nocturnal dyspnea.  Respiratory:  Negative for shortness of breath.   Hematologic/Lymphatic: Negative for bleeding problem.  Musculoskeletal:  Negative for muscle  cramps and myalgias.  Neurological:  Negative for dizziness and light-headedness.    PHYSICAL EXAM:    12/22/2022    2:51 PM 12/02/2022    9:52 AM 11/13/2022    3:08 PM  Vitals with BMI  Height 5\' 6"  5\' 6"  5\' 6"   Weight 179 lbs 3 oz 182 lbs 182 lbs 10 oz  BMI 28.94 123XX123 [id number]  Systolic 0000000 [id number]  Diastolic 90 A999333 89  Pulse 94 73 87    Physical Exam  Constitutional: No distress.  Appears older than stated age, hemodynamically stable.   Neck: No JVD present.  Cardiovascular: Normal rate, regular rhythm, S1 normal, S2 normal, intact distal pulses and normal pulses. Exam reveals no gallop, no S3 and no S4.  No murmur heard. Pulmonary/Chest: Effort normal and breath sounds normal. No stridor. He has no wheezes. He has no rales.  Abdominal: Soft. Bowel sounds are normal. He exhibits no distension. There is no abdominal tenderness.  Musculoskeletal:        General: No edema.     Cervical back: Neck supple.  Neurological: He is alert and oriented to person, place, and time. He has intact cranial nerves (2-12).  Skin: Skin is warm and moist.   CARDIAC DATABASE: EKG: 09/19/2022: Sinus bradycardia, 57 bpm, without underlying ischemia or injury pattern  Echocardiogram: 10/10/2022: Normal LV systolic function with visual EF 60-65%. Left ventricle cavity is normal in size. Normal left ventricular wall thickness. Normal global wall motion. Normal diastolic filling pattern, normal LAP. Mild pulmonic regurgitation. No prior study for comparison.     Stress Testing: Exercise treadmill stress test 10/10/2022: Exercise treadmill stress test performed using Bruce protocol. Patient reached 11.3 METS, and 89% of age predicted maximum heart rate. Exercise capacity was excellent. No chest pain reported. Normal heart rate and hemodynamic response. Stress EKG revealed no ischemic changes. Low risk study.    Heart Catheterization: None  CT Cardiac Scoring: Left Main: 7 LAD: 151 LCx: 3 RCA:  4 Total Agatston Score: 165 MESA database percentile: 96th  Noncardiac findings: NA   Cardiac monitor (Zio Patch): October 10, 2022 - October 24, 2022 Dominant rhythm sinus.  Heart rate 40-135 bpm.  Avg HR 77 bpm. No atrial fibrillation, supraventricular tachycardia, ventricular tachycardia, high grade AV block. Total ventricular ectopic burden <1%. Total supraventricular ectopic burden <1%. 2 pauses greater than 3 seconds (asymptomatic events).  Longest pause 10/23/2022 at 10:50 PM, 4.1 seconds. Second longest pause 10/19/2022 at 10:18 AM 3.5 seconds. Patient triggered events: 11.  Predominantly sinus or sinus tachycardia without any significant ectopy.  LABORATORY DATA:    Latest Ref Rng & Units 09/10/2022    8:00 PM 04/02/2016    3:45 AM 03/31/2016   11:55 AM  CBC  WBC 4.0 - 10.5 K/uL 13.8  5.7  8.2   Hemoglobin 13.0 - 17.0 g/dL 11.8  14.2  16.6   Hematocrit 39.0 - 52.0 % 34.5  39.5  44.1   Platelets 150 - 400 K/uL 196  203  242        Latest Ref Rng & Units 09/10/2022    8:00 PM 04/02/2016    3:45 AM 04/01/2016  6:17 AM  CMP  Glucose 70 - 99 mg/dL 160  349  185   BUN 6 - 20 mg/dL 13  18  15    Creatinine 0.61 - 1.24 mg/dL 1.02  0.79  0.89   Sodium 135 - 145 mmol/L 142  133  136   Potassium 3.5 - 5.1 mmol/L 3.7  4.0  3.9   Chloride 98 - 111 mmol/L 111  106  107   CO2 22 - 32 mmol/L 22  21  22    Calcium 8.9 - 10.3 mg/dL 8.1  8.8  8.5   Total Protein 6.5 - 8.1 g/dL 5.7     Total Bilirubin 0.3 - 1.2 mg/dL 1.1     Alkaline Phos 38 - 126 U/L 54     AST 15 - 41 U/L 16     ALT 0 - 44 U/L 9       Lipid Panel  No results found for: "CHOL", "TRIG", "HDL", "CHOLHDL", "VLDL", "LDLCALC", "LDLDIRECT", "LABVLDL"  No components found for: "NTPROBNP" No results for input(s): "PROBNP" in the last 8760 hours. No results for input(s): "TSH" in the last 8760 hours.  BMP Recent Labs    09/10/22 2000  NA 142  K 3.7  CL 111  CO2 22  GLUCOSE 160*  BUN 13  CREATININE 1.02   CALCIUM 8.1*  GFRNONAA >60    HEMOGLOBIN A1C Lab Results  Component Value Date   HGBA1C 9.8 (H) 03/31/2016   MPG 235 03/31/2016    IMPRESSION:    ICD-10-CM   1. Vasovagal syncope  R55     2. Sinus pause  I45.5     3. Primary hypertension  I10     4. Agatston coronary artery calcium score between 100 and 400  R93.1     5. Type 2 diabetes mellitus with hyperlipidemia (HCC)  E11.69    E78.5     6. Benign hypertension  I10 amLODipine (NORVASC) 5 MG tablet    7. Type 2 diabetes mellitus with hyperglycemia, without long-term current use of insulin (HCC)  E11.65     8. Cigarette smoker  F17.210         RECOMMENDATIONS: Jacob Santos is a 52 y.o. African-American male whose past medical history and cardiac risk factors include: Non-insulin-dependent diabetes mellitus type 2, hypertension, smoking.   Vasovagal syncope Sinus pause Index event 09/10/2022. Based on symptoms likely vasovagal etiology. Has undergone echocardiogram, GXT, coronary calcium score, and Zio patch results discussed with him in the past and noted above for further reference.   Referred to EP as a Zio patch noted 2 episodes of pauses greater than 3 seconds.   Saw Dr. Quentin Ore on 12/02/2022 and no additional testing or workup recommended.  He is cleared from EP standpoint to start driving. No recurrence of vasovagal syncope. Given nocturnal pauses I will had referred him to sleep medicine for sleep apnea evaluation.  Type 2 diabetes mellitus with hyperlipidemia ([place]) Reemphasized importance of glycemic control and lipid management. Will defer glycemic management to PCP. In the past despite being a diabetic patient was not placed on statin therapy.  However, given his CAC will start atorvastatin 20 mg p.o. nightly.  He will have repeat labs with PCP to reevaluate direct LDL.  Agatston coronary artery calcium score between 100 and 400 Total CAC 165, 96th percentile. Denies anginal discomfort. Echo January  2024: Preserved LVEF without any significant valvular heart disease. GXT: Low risk stress test. Educated on importance of secondary prevention.  Benign hypertension Office blood pressures are not well-controlled. Home blood pressures are also not well-controlled. Patient requesting assistance until he follows up with PCP in April 2024. Currently takes hydrochlorothiazide in the morning and olmesartan at night. Will start with amlodipine 5 mg p.o. every morning. He likely will need up titration of antihypertensive medications.  I have asked him to follow-up and discuss further medication changes with PCP at his upcoming office visit in April 2024  Type 2 diabetes mellitus with hyperglycemia, without long-term current use of insulin ([place]) Reemphasized importance of glycemic control. Currently managed by primary care provider.  Cigarette smoker Tobacco cessation counseling: Currently smoking 1 packs/day   Patient is not willing to quit at this time. 5 mins were spent counseling patient cessation techniques. We discussed various methods to help quit smoking, including deciding on a date to quit, joining a support group, pharmacological agents- nicotine gum/patch/lozenges.  I will reassess his progress at the next follow-up visit  FINAL MEDICATION LIST END OF ENCOUNTER: Meds ordered this encounter  Medications   amLODipine (NORVASC) 5 MG tablet    Sig: Take 1 tablet (5 mg total) by mouth every morning.    Dispense:  90 tablet    Refill:  0    There are no discontinued medications.    Current Outpatient Medications:    amLODipine (NORVASC) 5 MG tablet, Take 1 tablet (5 mg total) by mouth every morning., Disp: 90 tablet, Rfl: 0   aspirin EC 81 MG tablet, Take 1 tablet (81 mg total) by mouth daily. Swallow whole., Disp: 30 tablet, Rfl: 12   atorvastatin (LIPITOR) 20 MG tablet, Take 1 tablet (20 mg total) by mouth at bedtime., Disp: 90 tablet, Rfl: 0   blood glucose meter kit and  supplies, Dispense based on patient and insurance preference. Use up to four times daily as directed. (FOR ICD-9 250.00, 250.01)., Disp: 1 each, Rfl: 0   hydrochlorothiazide (HYDRODIURIL) 25 MG tablet, Take 1 tablet (25 mg total) by mouth every morning., Disp: 90 tablet, Rfl: 0   olmesartan (BENICAR) 40 MG tablet, Take 1 tablet (40 mg total) by mouth daily at 10 pm., Disp: 30 tablet, Rfl: 11  No orders of the defined types were placed in this encounter.   There are no Patient Instructions on file for this visit.   --Continue cardiac medications as reconciled in final medication list. --Return in about 1 year (around 12/22/2023) for Follow up history of syncope, Coronary artery calcification. or sooner if needed. --Continue follow-up with your primary care physician regarding the management of your other chronic comorbid conditions.  Patient's questions and concerns were addressed to his satisfaction. He voices understanding of the instructions provided during this encounter.   This note was created using a voice recognition software as a result there may be grammatical errors inadvertently enclosed that do not reflect the nature of this encounter. Every attempt is made to correct such errors.  Rex Kras, [place], [facility]  Pager:  [phone] Office: [phone]

## 2022-12-23 ENCOUNTER — Other Ambulatory Visit: Payer: Self-pay

## 2022-12-23 DIAGNOSIS — E1165 Type 2 diabetes mellitus with hyperglycemia: Secondary | ICD-10-CM

## 2022-12-23 DIAGNOSIS — R931 Abnormal findings on diagnostic imaging of heart and coronary circulation: Secondary | ICD-10-CM

## 2023-01-01 ENCOUNTER — Ambulatory Visit: Payer: PRIVATE HEALTH INSURANCE | Admitting: Cardiology

## 2024-03-30 ENCOUNTER — Other Ambulatory Visit: Payer: Self-pay

## 2024-03-30 ENCOUNTER — Emergency Department (HOSPITAL_COMMUNITY)
Admission: EM | Admit: 2024-03-30 | Discharge: 2024-03-30 | Disposition: A | Discharge status: Home / Self Care | Payer: Self-pay | Attending: Emergency Medicine | Admitting: Emergency Medicine

## 2024-03-30 ENCOUNTER — Encounter (HOSPITAL_COMMUNITY): Payer: Self-pay

## 2024-03-30 DIAGNOSIS — E119 Type 2 diabetes mellitus without complications: Secondary | ICD-10-CM | POA: Insufficient documentation

## 2024-03-30 DIAGNOSIS — I2510 Atherosclerotic heart disease of native coronary artery without angina pectoris: Secondary | ICD-10-CM | POA: Insufficient documentation

## 2024-03-30 DIAGNOSIS — L509 Urticaria, unspecified: Secondary | ICD-10-CM | POA: Insufficient documentation

## 2024-03-30 DIAGNOSIS — Z7982 Long term (current) use of aspirin: Secondary | ICD-10-CM | POA: Insufficient documentation

## 2024-03-30 DIAGNOSIS — Z79899 Other long term (current) drug therapy: Secondary | ICD-10-CM | POA: Insufficient documentation

## 2024-03-30 DIAGNOSIS — I10 Essential (primary) hypertension: Secondary | ICD-10-CM | POA: Insufficient documentation

## 2024-03-30 LAB — COMPREHENSIVE METABOLIC PANEL WITH GFR
ALT: 17 U/L (ref 0–44)
AST: 24 U/L (ref 15–41)
Albumin: 3.7 g/dL (ref 3.5–5.0)
Alkaline Phosphatase: 75 U/L (ref 38–126)
Anion gap: 11 (ref 5–15)
BUN: 16 mg/dL (ref 6–20)
CO2: 19 mmol/L — ABNORMAL LOW (ref 22–32)
Calcium: 9 mg/dL (ref 8.9–10.3)
Chloride: 108 mmol/L (ref 98–111)
Creatinine, Ser: 1 mg/dL (ref 0.61–1.24)
GFR, Estimated: >60 mL/min (ref >60)
Glucose, Bld: 174 mg/dL — ABNORMAL HIGH (ref 70–99)
Potassium: 3.7 mmol/L (ref 3.5–5.1)
Sodium: 138 mmol/L (ref 135–145)
Total Bilirubin: 1 mg/dL (ref 0.0–1.2)
Total Protein: 6.5 g/dL (ref 6.5–8.1)

## 2024-03-30 LAB — CBC WITH DIFFERENTIAL/PLATELET
Abs Immature Granulocytes: 0.09 10*3/uL — ABNORMAL HIGH (ref 0.00–0.07)
Basophils Absolute: 0.1 10*3/uL (ref 0.0–0.1)
Basophils Relative: 1 %
Eosinophils Absolute: 0.1 10*3/uL (ref 0.0–0.5)
Eosinophils Relative: 1 %
HCT: 41.8 % (ref 39.0–52.0)
Hemoglobin: 14.5 g/dL (ref 13.0–17.0)
Immature Granulocytes: 1 %
Lymphocytes Relative: 36 %
Lymphs Abs: 3.8 10*3/uL (ref 0.7–4.0)
MCH: 32.7 pg (ref 26.0–34.0)
MCHC: 34.7 g/dL (ref 30.0–36.0)
MCV: 94.4 fL (ref 80.0–100.0)
Monocytes Absolute: 0.8 10*3/uL (ref 0.1–1.0)
Monocytes Relative: 8 %
Neutro Abs: 5.5 10*3/uL (ref 1.7–7.7)
Neutrophils Relative %: 53 %
Platelets: 260 10*3/uL (ref 150–400)
RBC: 4.43 MIL/uL (ref 4.22–5.81)
RDW: 11.6 % (ref 11.5–15.5)
WBC: 10.4 10*3/uL (ref 4.0–10.5)
nRBC: 0 % (ref 0.0–0.2)

## 2024-03-30 MED ORDER — FAMOTIDINE IN NACL 20-0.9 MG/50ML-% IV SOLN
20.0000 mg | Freq: Once | INTRAVENOUS | Status: AC
  Administered 2024-03-30: 20 mg via INTRAVENOUS
  Filled 2024-03-30: qty 50

## 2024-03-30 MED ORDER — METHYLPREDNISOLONE SODIUM SUCC 125 MG IJ SOLR
125.0000 mg | Freq: Once | INTRAMUSCULAR | Status: AC
  Administered 2024-03-30: 125 mg via INTRAVENOUS
  Filled 2024-03-30: qty 2

## 2024-03-30 MED ORDER — PREDNISONE 20 MG PO TABS: ORAL_TABLET | ORAL | 0 refills | Status: AC

## 2024-03-30 NOTE — ED Triage Notes (Signed)
 Patient reports generalized skin itching with hives this evening , patient took children's Benadryl  prior to arrival with no relief . No oral swelling/airway intact , respirations unlabored.

## 2024-03-30 NOTE — ED Provider Notes (Signed)
 [place] EMERGENCY DEPARTMENT AT [facility] Provider Note   CSN: [id number] Arrival date & time: 03/30/24  0147     Patient presents with: Urticaria   Jacob Santos is a 53 y.o. male.   The history is provided by the patient.  Urticaria  He has history of hypertension, diabetes, coronary artery disease and comes in because of rash and itching.  He states that he started itching all over after taking a shower.  He denies any difficulty breathing or swallowing.  He took diphenhydramine  with partial relief.  He has never had a reaction like this before.  He has not been on any new medications or had any new exposures.   Prior to Admission medications   Medication Sig Start Date End Date Taking? Authorizing Provider  amLODipine  (NORVASC ) 5 MG tablet Take 1 tablet (5 mg total) by mouth every morning. 12/22/22 03/22/23  Tolia, Sunit, DO  aspirin  EC 81 MG tablet Take 1 tablet (81 mg total) by mouth daily. Swallow whole. 11/13/22   Tolia, Sunit, DO  atorvastatin  (LIPITOR) 20 MG tablet Take 1 tablet (20 mg total) by mouth at bedtime. 11/13/22 02/11/23  Tolia, Sunit, DO  blood glucose meter kit and supplies Dispense based on patient and insurance preference. Use up to four times daily as directed. (FOR ICD-9 250.00, 250.01). 04/03/16   Zella, Mir M, MD  hydrochlorothiazide  (HYDRODIURIL ) 25 MG tablet Take 1 tablet (25 mg total) by mouth every morning. 11/13/22 02/11/23  Tolia, Sunit, DO  olmesartan  (BENICAR ) 40 MG tablet Take 1 tablet (40 mg total) by mouth daily at 10 pm. 12/17/22   Michele, Sunit, DO    Allergies: Penicillins    Review of Systems  All other systems reviewed and are negative.   Updated Vital Signs BP (!) 157/110 (BP Location: Right Arm)   Pulse (!) 108   Temp 98.6 F (37 C)   Resp 18   SpO2 97%   Physical Exam Vitals and nursing note reviewed.   53 year old male, resting comfortably and in no acute distress. Vital signs are significant for elevated blood  pressure and slightly elevated heart rate. Oxygen saturation is 97%, which is normal. Head is normocephalic and atraumatic. PERRLA, EOMI. Oropharynx is clear. Lungs are clear without rales, wheezes, or rhonchi. Heart has regular rate and rhythm without murmur. Abdomen is soft, flat, nontender. Skin has diffuse urticarial rash. Neurologic: Awake and alert, moves all extremities equally.  (all labs ordered are listed, but only abnormal results are displayed) Labs Reviewed  CBC WITH DIFFERENTIAL/PLATELET - Abnormal; Notable for the following components:      Result Value   Abs Immature Granulocytes 0.09 (*)    All other components within normal limits  COMPREHENSIVE METABOLIC PANEL WITH GFR - Abnormal; Notable for the following components:   CO2 19 (*)    Glucose, Bld 174 (*)    All other components within normal limits    EKG: None  Radiology: No results found.   Procedures   Medications Ordered in the ED - No data to display                                  Medical Decision Making Amount and/or Complexity of Data Reviewed Labs: ordered.  Risk Prescription drug management.   Acute urticaria, specific etiology unclear.  He has already taken diphenhydramine  at home.  I have ordered famotidine  and methylprednisolone.  I have reviewed his laboratory test, my interpretation is normal CBC, mild elevation of glucose consistent with known history of diabetes.  He had excellent relief of symptoms with above-noted treatment.  On reexam, urticaria has resolved.  I am discharging him with instructions use over-the-counter second-generation antihistamines, supplement with first generation antihistamine and famotidine  as needed.  Also, prescription for a short course of prednisone .  Follow-up with PCP, return precautions discussed.     Final diagnoses:  Urticaria    ED Discharge Orders          Ordered    predniSONE  (DELTASONE ) 20 MG tablet        03/30/24 9390                Raford Lenis, MD 03/30/24 [id number]

## 2024-03-30 NOTE — Discharge Instructions (Addendum)
 Take loratadine (Claritin) or cetirizine (Zyrtec) once a day for the next week.  Take diphenhydramine  (Benadryl ) as needed for breakthrough itching.  If the itching and rash are difficult to control, add famotidine  (Pepcid  AC).  Return to the emergency department if you have difficulty breathing or swallowing.
